# Patient Record
Sex: Female | Born: 1935 | Race: Black or African American | Hispanic: No | Smoking: Never smoker
Health system: Southern US, Community
[De-identification: ages and names within clinical notes are randomized; demographics above are authoritative.]

## PROBLEM LIST (undated history)

## (undated) DIAGNOSIS — E039 Hypothyroidism, unspecified: Secondary | ICD-10-CM

## (undated) DIAGNOSIS — M8580 Other specified disorders of bone density and structure, unspecified site: Secondary | ICD-10-CM

## (undated) DIAGNOSIS — C50919 Malignant neoplasm of unspecified site of unspecified female breast: Secondary | ICD-10-CM

## (undated) DIAGNOSIS — D0590 Unspecified type of carcinoma in situ of unspecified breast: Secondary | ICD-10-CM

## (undated) HISTORY — DX: Malignant neoplasm of unspecified site of unspecified female breast: C50.919

## (undated) HISTORY — DX: Other specified disorders of bone density and structure, unspecified site: M85.80

## (undated) HISTORY — PX: BREAST SURGERY: SHX581

## (undated) HISTORY — DX: Hypothyroidism, unspecified: E03.9

## (undated) HISTORY — DX: Unspecified type of carcinoma in situ of unspecified breast: D05.90

---

## 2001-06-27 HISTORY — PX: BREAST SURGERY: SHX581

## 2002-02-18 ENCOUNTER — Ambulatory Visit (HOSPITAL_COMMUNITY): Admission: RE | Admit: 2002-02-18 | Discharge: 2002-02-18 | Payer: Self-pay | Admitting: *Deleted

## 2002-02-18 ENCOUNTER — Encounter: Payer: Self-pay | Admitting: *Deleted

## 2002-05-15 ENCOUNTER — Encounter: Admission: RE | Admit: 2002-05-15 | Discharge: 2002-05-15 | Payer: Self-pay | Admitting: Family Medicine

## 2002-05-15 ENCOUNTER — Encounter: Payer: Self-pay | Admitting: Family Medicine

## 2002-06-04 ENCOUNTER — Encounter: Payer: Self-pay | Admitting: Family Medicine

## 2002-06-04 ENCOUNTER — Encounter (INDEPENDENT_AMBULATORY_CARE_PROVIDER_SITE_OTHER): Payer: Self-pay | Admitting: *Deleted

## 2002-06-04 ENCOUNTER — Other Ambulatory Visit: Admission: RE | Admit: 2002-06-04 | Discharge: 2002-06-04 | Payer: Self-pay | Admitting: Diagnostic Radiology

## 2002-06-04 ENCOUNTER — Encounter: Admission: RE | Admit: 2002-06-04 | Discharge: 2002-06-04 | Payer: Self-pay | Admitting: Family Medicine

## 2002-07-01 ENCOUNTER — Encounter: Payer: Self-pay | Admitting: Surgery

## 2002-07-01 ENCOUNTER — Encounter: Admission: RE | Admit: 2002-07-01 | Discharge: 2002-07-01 | Payer: Self-pay | Admitting: Surgery

## 2002-07-02 ENCOUNTER — Ambulatory Visit (HOSPITAL_BASED_OUTPATIENT_CLINIC_OR_DEPARTMENT_OTHER): Admission: RE | Admit: 2002-07-02 | Discharge: 2002-07-03 | Payer: Self-pay | Admitting: Surgery

## 2002-07-02 ENCOUNTER — Encounter (INDEPENDENT_AMBULATORY_CARE_PROVIDER_SITE_OTHER): Payer: Self-pay | Admitting: *Deleted

## 2002-07-02 ENCOUNTER — Ambulatory Visit: Admission: RE | Admit: 2002-07-02 | Discharge: 2002-08-13 | Payer: Self-pay | Admitting: *Deleted

## 2002-07-02 ENCOUNTER — Encounter: Payer: Self-pay | Admitting: Surgery

## 2002-07-25 ENCOUNTER — Ambulatory Visit (HOSPITAL_COMMUNITY): Admission: RE | Admit: 2002-07-25 | Discharge: 2002-07-25 | Payer: Self-pay | Admitting: Oncology

## 2002-07-30 ENCOUNTER — Ambulatory Visit (HOSPITAL_BASED_OUTPATIENT_CLINIC_OR_DEPARTMENT_OTHER): Admission: RE | Admit: 2002-07-30 | Discharge: 2002-07-30 | Payer: Self-pay | Admitting: Surgery

## 2002-07-30 ENCOUNTER — Encounter: Payer: Self-pay | Admitting: Surgery

## 2002-07-31 ENCOUNTER — Encounter: Payer: Self-pay | Admitting: Oncology

## 2002-07-31 ENCOUNTER — Ambulatory Visit (HOSPITAL_COMMUNITY): Admission: RE | Admit: 2002-07-31 | Discharge: 2002-07-31 | Payer: Self-pay | Admitting: Oncology

## 2002-08-02 ENCOUNTER — Encounter: Payer: Self-pay | Admitting: Oncology

## 2002-08-02 ENCOUNTER — Ambulatory Visit (HOSPITAL_COMMUNITY): Admission: RE | Admit: 2002-08-02 | Discharge: 2002-08-02 | Payer: Self-pay | Admitting: Oncology

## 2002-11-11 ENCOUNTER — Inpatient Hospital Stay (HOSPITAL_COMMUNITY): Admission: AD | Admit: 2002-11-11 | Discharge: 2002-11-15 | Payer: Self-pay | Admitting: Oncology

## 2002-11-13 ENCOUNTER — Encounter: Payer: Self-pay | Admitting: Oncology

## 2002-11-25 ENCOUNTER — Ambulatory Visit: Admission: RE | Admit: 2002-11-25 | Discharge: 2003-02-11 | Payer: Self-pay | Admitting: *Deleted

## 2003-01-10 ENCOUNTER — Ambulatory Visit (HOSPITAL_BASED_OUTPATIENT_CLINIC_OR_DEPARTMENT_OTHER): Admission: RE | Admit: 2003-01-10 | Discharge: 2003-01-10 | Payer: Self-pay | Admitting: Surgery

## 2003-03-19 ENCOUNTER — Encounter: Admission: RE | Admit: 2003-03-19 | Discharge: 2003-03-19 | Payer: Self-pay | Admitting: Oncology

## 2003-03-19 ENCOUNTER — Encounter: Payer: Self-pay | Admitting: Oncology

## 2003-08-24 ENCOUNTER — Emergency Department (HOSPITAL_COMMUNITY): Admission: EM | Admit: 2003-08-24 | Discharge: 2003-08-25 | Payer: Self-pay | Admitting: Emergency Medicine

## 2003-09-03 ENCOUNTER — Encounter: Admission: RE | Admit: 2003-09-03 | Discharge: 2003-09-03 | Payer: Self-pay | Admitting: Oncology

## 2003-10-15 ENCOUNTER — Ambulatory Visit (HOSPITAL_COMMUNITY): Admission: RE | Admit: 2003-10-15 | Discharge: 2003-10-15 | Payer: Self-pay | Admitting: Gastroenterology

## 2004-03-19 ENCOUNTER — Encounter: Admission: RE | Admit: 2004-03-19 | Discharge: 2004-03-19 | Payer: Self-pay | Admitting: Oncology

## 2004-06-22 ENCOUNTER — Ambulatory Visit: Payer: Self-pay | Admitting: Oncology

## 2004-07-07 ENCOUNTER — Ambulatory Visit (HOSPITAL_COMMUNITY): Admission: RE | Admit: 2004-07-07 | Discharge: 2004-07-07 | Payer: Self-pay | Admitting: Oncology

## 2004-12-23 ENCOUNTER — Ambulatory Visit: Payer: Self-pay | Admitting: Oncology

## 2005-03-21 ENCOUNTER — Encounter: Admission: RE | Admit: 2005-03-21 | Discharge: 2005-03-21 | Payer: Self-pay | Admitting: Oncology

## 2005-06-30 ENCOUNTER — Ambulatory Visit: Payer: Self-pay | Admitting: Oncology

## 2006-03-22 ENCOUNTER — Encounter: Admission: RE | Admit: 2006-03-22 | Discharge: 2006-03-22 | Payer: Self-pay | Admitting: Oncology

## 2006-06-24 ENCOUNTER — Ambulatory Visit: Payer: Self-pay | Admitting: Oncology

## 2006-06-29 LAB — CBC WITH DIFFERENTIAL/PLATELET
BASO%: 0.2 % (ref 0.0–2.0)
Basophils Absolute: 0 10*3/uL (ref 0.0–0.1)
EOS%: 2.4 % (ref 0.0–7.0)
HGB: 13.7 g/dL (ref 11.6–15.9)
MCH: 28.1 pg (ref 26.0–34.0)
MCHC: 32.7 g/dL (ref 32.0–36.0)
MCV: 86 fL (ref 81.0–101.0)
MONO%: 5.7 % (ref 0.0–13.0)
RBC: 4.85 10*6/uL (ref 3.70–5.32)
RDW: 14.1 % (ref 11.3–14.5)
lymph#: 2.1 10*3/uL (ref 0.9–3.3)

## 2006-06-29 LAB — COMPREHENSIVE METABOLIC PANEL
ALT: 18 U/L (ref 0–35)
AST: 23 U/L (ref 0–37)
Albumin: 4.7 g/dL (ref 3.5–5.2)
Alkaline Phosphatase: 114 U/L (ref 39–117)
BUN: 12 mg/dL (ref 6–23)
Chloride: 100 mEq/L (ref 96–112)
Potassium: 4.1 mEq/L (ref 3.5–5.3)
Sodium: 141 mEq/L (ref 135–145)

## 2006-06-29 LAB — LACTATE DEHYDROGENASE: LDH: 208 U/L (ref 94–250)

## 2006-07-18 ENCOUNTER — Encounter: Admission: RE | Admit: 2006-07-18 | Discharge: 2006-07-18 | Payer: Self-pay | Admitting: Oncology

## 2006-12-26 ENCOUNTER — Ambulatory Visit: Payer: Self-pay | Admitting: Oncology

## 2007-01-02 LAB — LACTATE DEHYDROGENASE: LDH: 210 U/L (ref 94–250)

## 2007-01-02 LAB — CBC WITH DIFFERENTIAL/PLATELET
BASO%: 0.5 % (ref 0.0–2.0)
HCT: 39 % (ref 34.8–46.6)
HGB: 13.1 g/dL (ref 11.6–15.9)
MCHC: 33.6 g/dL (ref 32.0–36.0)
MONO#: 0.3 10*3/uL (ref 0.1–0.9)
NEUT%: 59 % (ref 39.6–76.8)
WBC: 5.5 10*3/uL (ref 3.9–10.0)
lymph#: 1.8 10*3/uL (ref 0.9–3.3)

## 2007-01-02 LAB — COMPREHENSIVE METABOLIC PANEL
CO2: 29 mEq/L (ref 19–32)
Creatinine, Ser: 0.9 mg/dL (ref 0.40–1.20)
Glucose, Bld: 75 mg/dL (ref 70–99)
Total Bilirubin: 0.8 mg/dL (ref 0.3–1.2)

## 2007-03-28 ENCOUNTER — Encounter: Admission: RE | Admit: 2007-03-28 | Discharge: 2007-03-28 | Payer: Self-pay | Admitting: Oncology

## 2007-07-20 ENCOUNTER — Ambulatory Visit: Payer: Self-pay | Admitting: Oncology

## 2008-03-28 ENCOUNTER — Encounter: Admission: RE | Admit: 2008-03-28 | Discharge: 2008-03-28 | Payer: Self-pay | Admitting: Family Medicine

## 2008-04-16 ENCOUNTER — Ambulatory Visit: Payer: Self-pay | Admitting: Oncology

## 2008-07-10 ENCOUNTER — Ambulatory Visit: Payer: Self-pay | Admitting: Oncology

## 2009-03-07 IMAGING — CT CT ABD-PELV W/O CM
3 of 8 series · 11 of 42 positions shown, 17 images · IV contrast (CONTRAST)
Comparison: NONE

CLINICAL DATA: Tani Tila:Bela Proano, FNP Right lower quadrant 
pain,  bloating.  Hx. of breast cancer. 

CT ABDOMEN AND PELVIS WITHOUT AND WITH INTRAVENOUS AND FOLLOWING 
ORAL  CONTRAST
TECHNIQUE: Multiple axial 5-millimeter thick slices at 
5-millimeter intervals were obtained from the lung base through 
the pelvis following the intravenous administration of 100 cc of 
Optiray 350 at a rate of 3 cc per second.  Oral contrast was 
administered as well.  Arterial and venous phase imaging was 
obtained in the upper abdomen with delayed images obtained through 
the pelvis.

[Series 4: venous · axial · portal-venous · 0.68mm/px · z∈[+672,+952]mm · 5 of 84 slices shown, 10 images]
[im 14/84  soft-tissue]
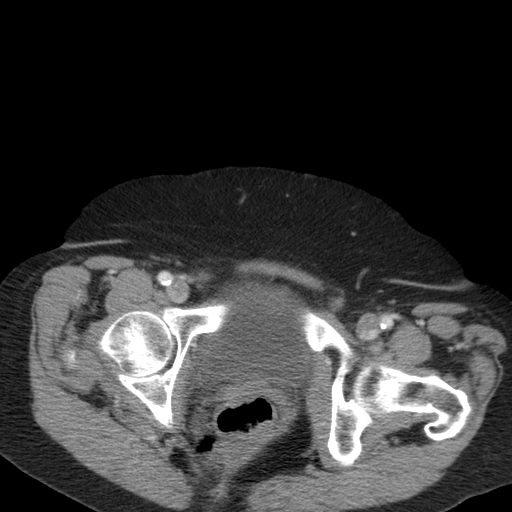
[im 14/84  bone]
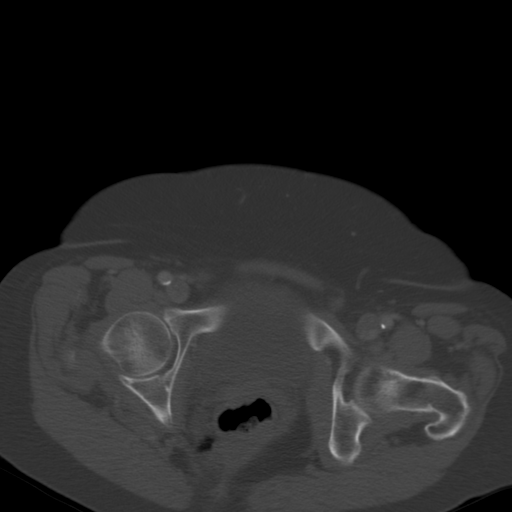
[im 28/84  soft-tissue]
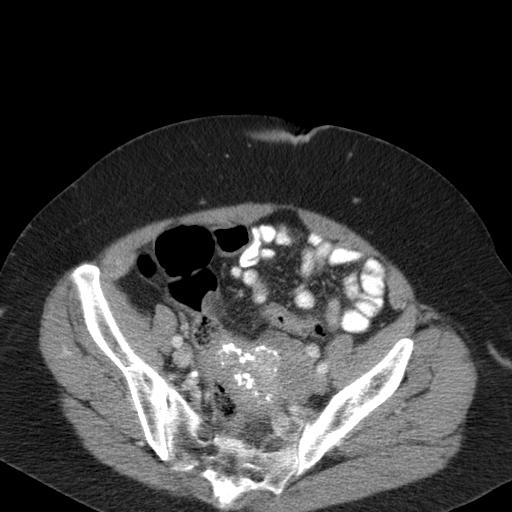
[im 28/84  lung]
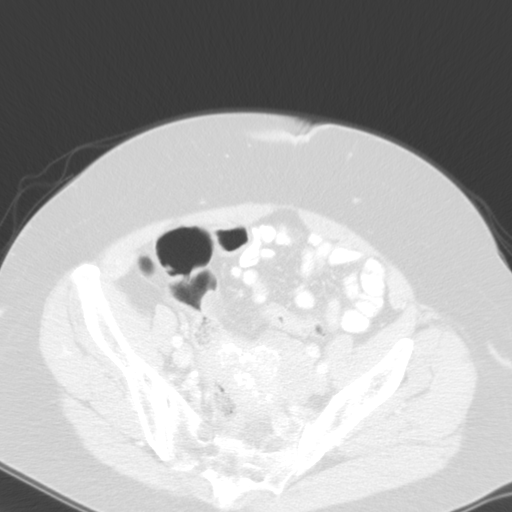
[im 42/84  soft-tissue]
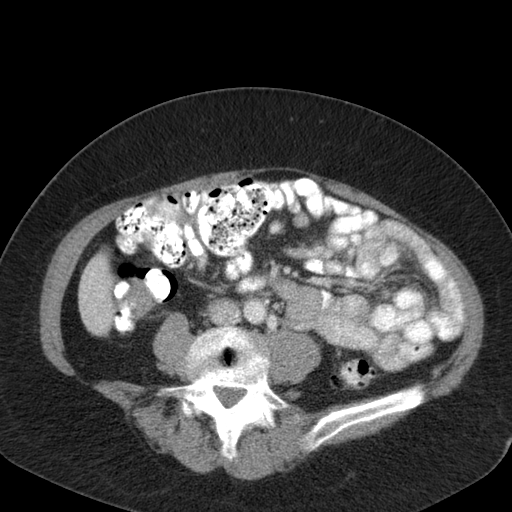
[im 42/84  lung]
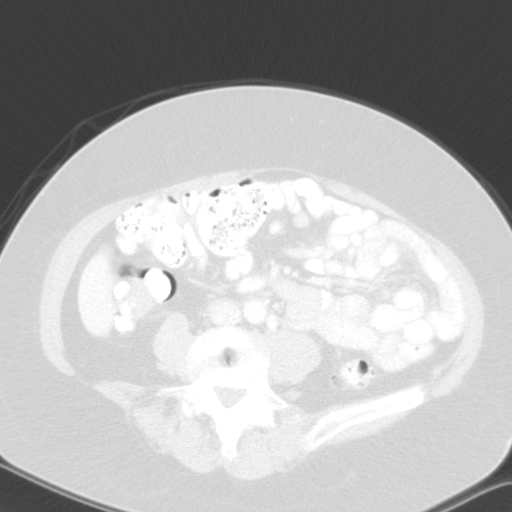
[im 56/84  soft-tissue]
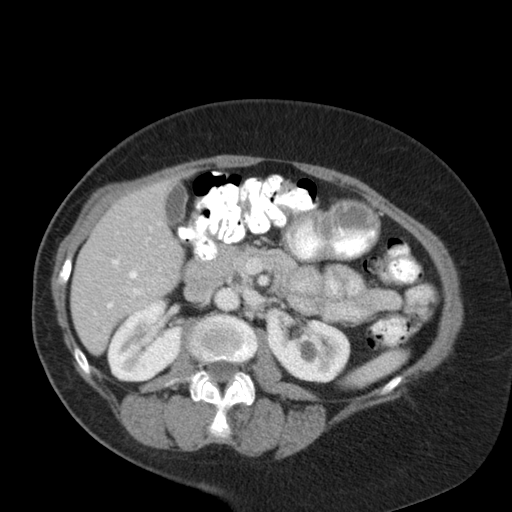
[im 56/84  lung]
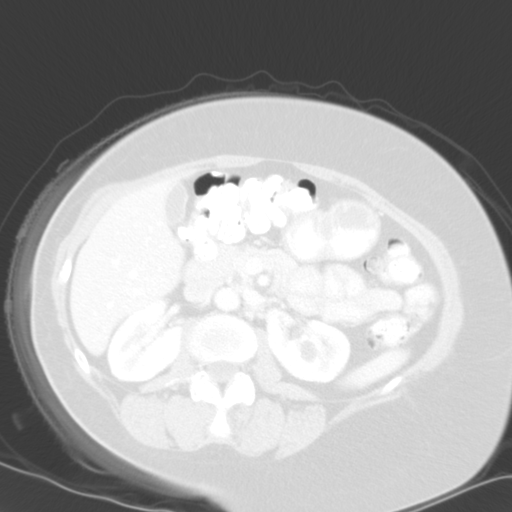
[im 70/84  soft-tissue]
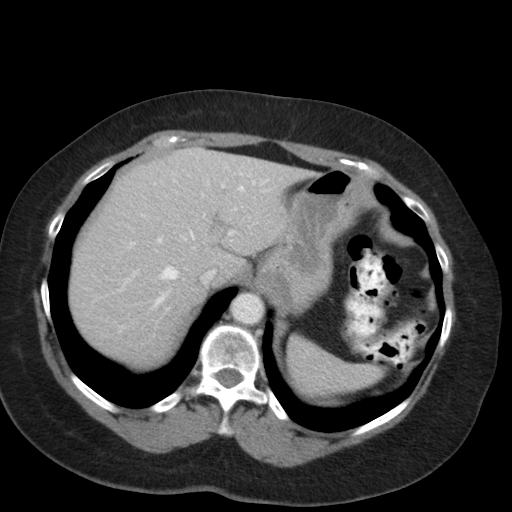
[im 70/84  lung]
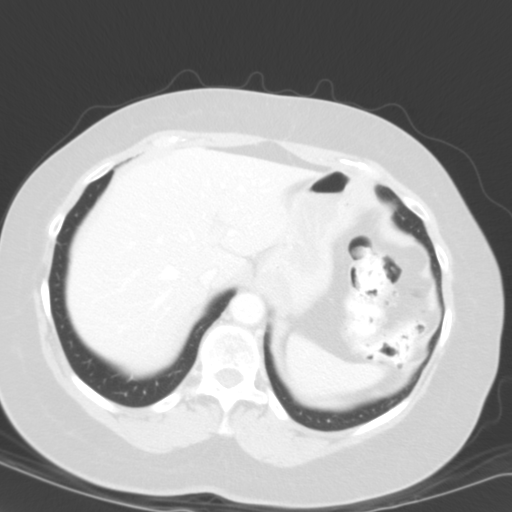

[Series 9: delays · axial · 0.68mm/px · z∈[+725,+890]mm · 3 of 67 slices shown]
[im 17/67  soft-tissue]
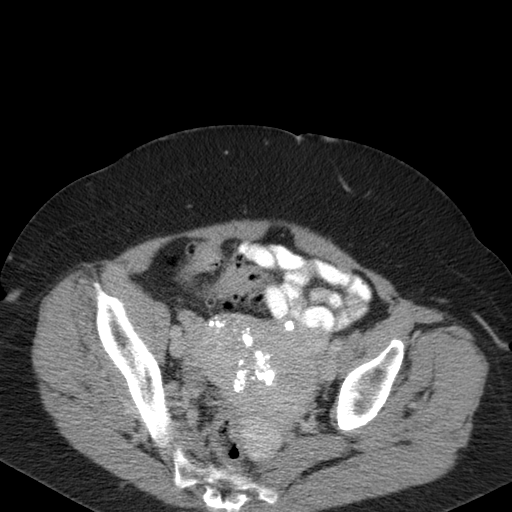
[im 34/67  soft-tissue]
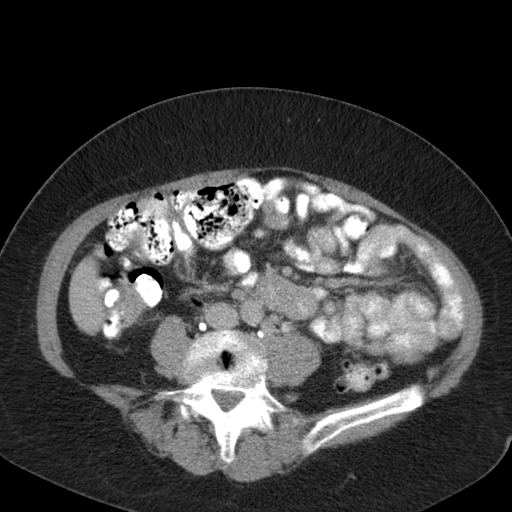
[im 50/67  soft-tissue]
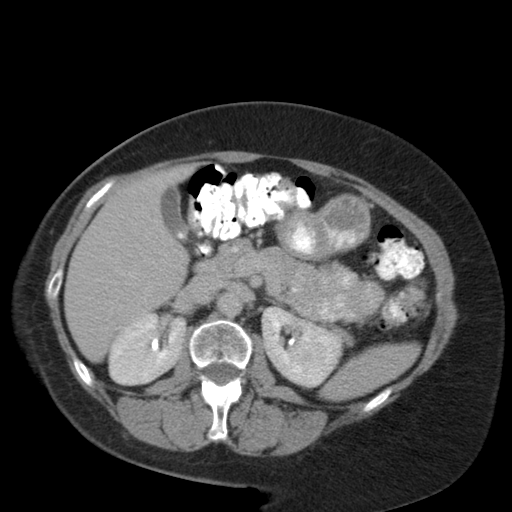

[coronals · coronal · 0.81mm/px · 3 of 72 slices shown, 4 images]
[im 24/72  soft-tissue]
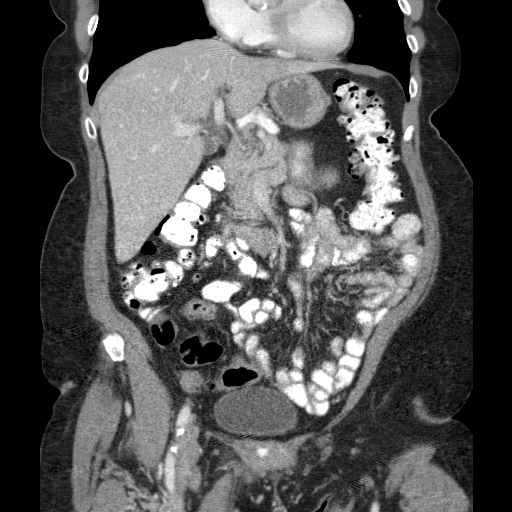
[im 32/72  soft-tissue]
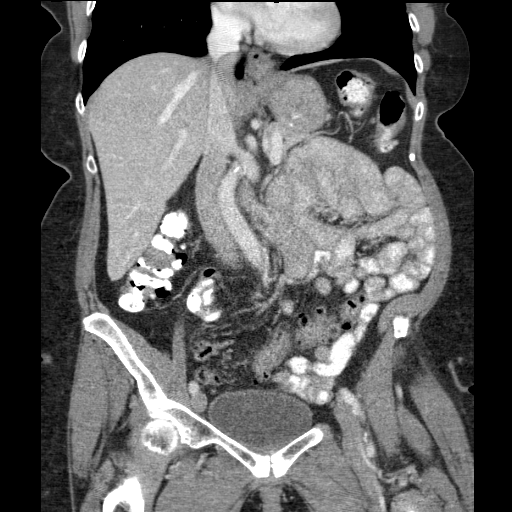
[im 32/72  bone]
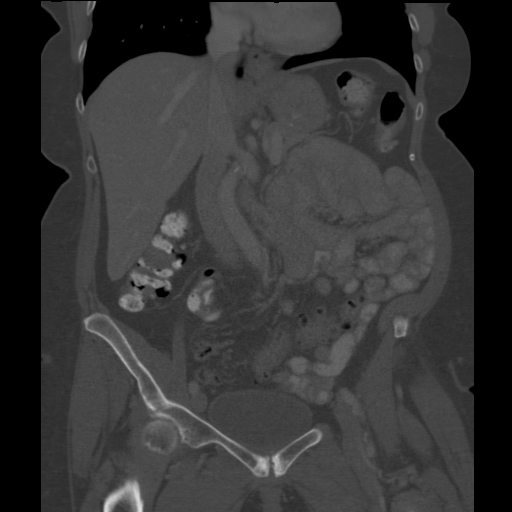
[im 40/72  soft-tissue]
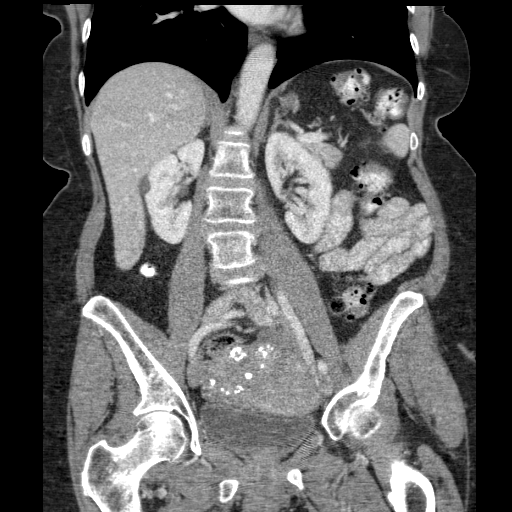

[11 of 42 positions shown; findings below may reference images not displayed]

FINDINGS: The heart size is within normal limits.  Visualized 
portions of the lung bases are within normal limits.  Liver, 
spleen, adrenal glands, and gallbladder are within normal limits.  
Patient appears to have a mobile cecum.  The appendix is 
visualized and demonstrates normal caliber and wall thickness.  
There is evidence of diverticula in the sigmoid colon, as well as 
the descending colon.  The uterus appears to be enlarged.  The 
endometrial stripe is not well seen.  There are multiple punctate 
calcifications noted, which may be associated with leiomyomata.  
The right ovary is grossly within normal limits.  The left ovary 
is not discretely seen.  No gross intraluminal mass is noted in 
the bladder.  There is suggestion of a polyp in the region of the 
rectum. No inflammatory changes are seen in the abdomen or pelvis. 
 There is mild dextroscoliosis.  Multiple hypodense cortical 
lesions are seen in each kidney.  The largest in the right kidney 
measures approximately 12 mm in diameter and the largest on the 
left is seen in the midpole and measures approximately 11 mm in 
diameter.  These are too small to obtain an accurate density 
measurement.  These likely represent renal cysts.  The pancreas 
demonstrates a dorsal and ventral duct consistent with pancreas 
divisum.  No pancreatic masses are evident. No free fluid or 
lymphadenopathy.
IMPRESSION: Diverticulosis, but no evidence of diverticulitis. 
Mobile cecum. No evidence of appendicitis or aortic aneurysm. 
Bilateral hypodense lesions of the kidneys, which likely represent 
renal cysts. Findings consistent with pancreas divisum. The 
initial suggestion of a polyp in the rectum on the venous phase 
images do not persist on delayed imaging and likely represents 
fecal material. Enlarged uterus with multiple calcifications 
suggesting calcified leiomyomata. Mihir Rodrigue, M.D. 
Date: 01/09/2007 JH  JLM

## 2009-03-30 ENCOUNTER — Encounter: Admission: RE | Admit: 2009-03-30 | Discharge: 2009-03-30 | Payer: Self-pay | Admitting: Oncology

## 2009-07-02 ENCOUNTER — Ambulatory Visit: Payer: Self-pay | Admitting: Oncology

## 2009-07-09 LAB — COMPREHENSIVE METABOLIC PANEL
ALT: 15 U/L (ref 0–35)
BUN: 14 mg/dL (ref 6–23)
CO2: 26 mEq/L (ref 19–32)
Calcium: 9.1 mg/dL (ref 8.4–10.5)
Chloride: 101 mEq/L (ref 96–112)
Creatinine, Ser: 0.83 mg/dL (ref 0.40–1.20)
Potassium: 3.8 mEq/L (ref 3.5–5.3)
Total Protein: 6.5 g/dL (ref 6.0–8.3)

## 2009-07-09 LAB — CBC WITH DIFFERENTIAL/PLATELET
BASO%: 0.2 % (ref 0.0–2.0)
EOS%: 1.7 % (ref 0.0–7.0)
HCT: 40 % (ref 34.8–46.6)
HGB: 13.1 g/dL (ref 11.6–15.9)
MONO#: 0.4 10*3/uL (ref 0.1–0.9)
MONO%: 5.9 % (ref 0.0–14.0)
RDW: 14.4 % (ref 11.2–14.5)
WBC: 6.1 10*3/uL (ref 3.9–10.3)

## 2010-03-11 ENCOUNTER — Encounter: Admission: RE | Admit: 2010-03-11 | Discharge: 2010-03-11 | Payer: Self-pay | Admitting: Oncology

## 2010-03-15 ENCOUNTER — Encounter: Admission: RE | Admit: 2010-03-15 | Discharge: 2010-03-15 | Payer: Self-pay | Admitting: Oncology

## 2010-03-22 ENCOUNTER — Encounter: Admission: RE | Admit: 2010-03-22 | Discharge: 2010-03-22 | Payer: Self-pay | Admitting: Family Medicine

## 2010-04-07 ENCOUNTER — Ambulatory Visit: Payer: Self-pay | Admitting: Oncology

## 2010-04-08 ENCOUNTER — Ambulatory Visit (HOSPITAL_COMMUNITY): Admission: RE | Admit: 2010-04-08 | Discharge: 2010-04-09 | Payer: Self-pay | Admitting: Surgery

## 2010-04-08 ENCOUNTER — Encounter (INDEPENDENT_AMBULATORY_CARE_PROVIDER_SITE_OTHER): Payer: Self-pay | Admitting: Surgery

## 2010-05-07 ENCOUNTER — Ambulatory Visit: Payer: Self-pay | Admitting: Oncology

## 2010-07-17 ENCOUNTER — Encounter: Payer: Self-pay | Admitting: Oncology

## 2010-07-18 ENCOUNTER — Encounter: Payer: Self-pay | Admitting: Oncology

## 2010-09-08 LAB — CBC
HCT: 38.5 % (ref 36.0–46.0)
Hemoglobin: 12.8 g/dL (ref 12.0–15.0)
MCHC: 33.2 g/dL (ref 30.0–36.0)
RBC: 4.48 MIL/uL (ref 3.87–5.11)
WBC: 4.5 10*3/uL (ref 4.0–10.5)

## 2010-09-08 LAB — SURGICAL PCR SCREEN
MRSA, PCR: NEGATIVE
Staphylococcus aureus: NEGATIVE

## 2010-09-08 LAB — COMPREHENSIVE METABOLIC PANEL
ALT: 20 U/L (ref 0–35)
AST: 21 U/L (ref 0–37)
Alkaline Phosphatase: 71 U/L (ref 39–117)
CO2: 28 mEq/L (ref 19–32)
Calcium: 9.6 mg/dL (ref 8.4–10.5)
Chloride: 107 mEq/L (ref 96–112)
Creatinine, Ser: 0.8 mg/dL (ref 0.4–1.2)
GFR calc non Af Amer: >60 mL/min (ref >60)
Total Bilirubin: 1.2 mg/dL (ref 0.3–1.2)
Total Protein: 6.5 g/dL (ref 6.0–8.3)

## 2010-09-08 LAB — DIFFERENTIAL
Basophils Absolute: 0 10*3/uL (ref 0.0–0.1)
Lymphs Abs: 1.5 10*3/uL (ref 0.7–4.0)
Monocytes Absolute: 0.3 10*3/uL (ref 0.1–1.0)
Neutrophils Relative %: 58 % (ref 43–77)

## 2010-11-12 NOTE — Op Note (Signed)
   NAME:  Rebecca Carrillo, Rebecca Carrillo                        ACCOUNT NO.:  [id number]   MEDICAL RECORD NO.:  [id number]                   PATIENT TYPE:  AMB   LOCATION:  DSC                                  FACILITY:  [facility]   PHYSICIAN:  Sandria Bales. Ezzard Standing, M.D.               DATE OF BIRTH:  12-20-1934   DATE OF PROCEDURE:  01/10/2003  DATE OF DISCHARGE:                                 OPERATIVE REPORT   PREOPERATIVE DIAGNOSIS:  Carcinoma of the right breast, completion of  chemotherapy.   POSTOPERATIVE DIAGNOSIS:  Carcinoma of the right breast, completion of  chemotherapy.   PROCEDURE:  Removal of left subclavian porta-cath.   SURGEON:  Sandria Bales. Ezzard Standing, M.D.   ANESTHESIA:  15 mL of 1% Xylocaine with epinephrine.   INDICATIONS FOR PROCEDURE:  Ms. Tiedt is a pleasant 76 year old black  female who has a T1, N1 carcinoma of the right breast. She has completed  chemotherapy with Dr. Cephas Darby. She is now about midway through her  radiation therapy with Dr. Jackelyn Knife and comes for removal  of the porta-  cath that was placed for chemotherapy.   DESCRIPTION OF PROCEDURE:  The patient was placed in the supine position.  Her left upper chest was prepped with Betadine solution and sterilely  draped. I infiltrated the skin with about 15 mL of saline.   I then cut down to the porta-cath which I removed in 1 piece. The patient  did not want to keep her porta-cath. I then closed it with 5-0 Vicryl. I  painted it with tincture of Benzoin and Steri-Strips. The patient tolerated  the procedure well.   She will see me back in a routine visit in about  6 to 8 weeks. She will  call earlier if there is any question with the incision.                                               Sandria Bales. Ezzard Standing, M.D.    DHN/MEDQ  D:  01/10/2003  T:  01/11/2003  Job:  [id number]   cc:   Genene Churn. Cyndie Chime, M.D.  [address] [facility]  [place]  [place] [address]  Fax: [phone]   Elizabeth Palau, M.D.   Elmer Sow.  Dorna Bloom, M.D.  [address] [facility]  [place]  [place]  [address]  Fax: [phone]

## 2010-11-12 NOTE — Discharge Summary (Signed)
NAME:  Rebecca Carrillo, Rebecca Carrillo                        ACCOUNT NO.:  [id number]   MEDICAL RECORD NO.:  [id number]                   PATIENT TYPE:  INP   LOCATION:  0275                                 FACILITY:  [facility]   PHYSICIAN:  Leighton Roach. Truett Perna, M.D.              DATE OF BIRTH:  1935-03-26   DATE OF ADMISSION:  11/11/2002  DATE OF DISCHARGE:  11/15/2002                                 DISCHARGE SUMMARY   DISCHARGE DIAGNOSES:  1. Severe oral and esophageal candidiasis, improved with Diflucan.  2. Dehydration secondary to #1.  3. Pain secondary to #1, improved.  4. Fever possibly secondary to #1. All cultures negative.  5. Pancytopenia secondary to chemotherapy with stable counts at time of     discharge. Patient to receive Aranesp 100 mcg subcu prior to discharge.  6. Stage II breast cancer status post cycle #5 Epirubicin/Cytoxan/5-FU day     #11.  7. Hypertension, stable.  8. Hypothyroidism, on Synthroid.   CONSULTATIONS:  None.   PROCEDURE:  None.   HISTORY OF PRESENT ILLNESS:  [name] is a 76 year old woman who was  diagnosed with stage II breast cancer in November of 2003. She underwent a  right breast partial mastectomy/right axillary node dissection in January of  2004 with pathology showing a 1.5 cm tumor; 1 sentinel lymph node positive,  20 lymph nodes negative; no vascular or lymphatic invasion; ER/PR negative;  HER-2/neu not over-expressed. The patient was started on treatment with  Epirubicin, Cytoxan, and 5-FU chemotherapy. Prior to this admission, she had  completed five of a total planned six cycles. She received her most recent  treatment on Nov 04, 2002, followed by a Neulasta injection on Nov 05, 2002.  She had tolerated all previous cycles well.   On Nov 11, 2002, the patient presented to the office with complaints of a 3-  4 day history of worsening mouth and throat pain to the point that she was  unable to maintain adequate oral intake. She reported thick  and clear oral  secretions. She used Viscous lidocaine and Motrin the weekend prior to  admission with only minimal improvement. She denied any fever, shortness of  breath, cough or diarrhea. She was found to have extensive oral candidiasis  and was subsequently admitted for Diflucan intravenously and hydration.  Vital signs in the office showed a temperature of 100.5, heart rate 111,  respirations 16, blood pressure 110/76.   PHYSICAL EXAMINATION:  Admission physical examination was unremarkable  except for extensive oral candidiasis.   HOSPITAL COURSE:  The patient was admitted to [facility]  on Nov 11, 2002. Lab work showed hemoglobin of 10.3, white count 4.22, ANC  3.73, platelets 106,000. The patient was started on Diflucan 200 mg IV  daily. She received IV Morphine for pain control. By day three of her  admission, the candidiasis was significantly improved and patient was  tolerating a  soft diet. By day four of her admission, there was resolution  of the thrush and Diflucan was changed to oral route. Her pain had also  resolved. On Nov 12, 2002, the patient developed a fever up to 100.9  degrees. Blood culture from Port-A-Cath was obtained and remained negative.  A urine culture was also checked and was negative. The patient was placed on  cefepime 2 g  IV every eight hours. She continued to have intermittent  temperature elevations over the next 48 hours. Chest x-ray obtained on October 14, 2002 was negative.  Her Port-A-Cath site remained unremarkable. The  cefepime was continued for three days. The patient remained afebrile in the  24 hours prior to her discharge. The fevers were felt to possibly be  secondary to the extensive oral/esophageal candidiasis. The patient has been  instructed to call the office should she develop recurrent fever following  her discharge home.   The patient's blood counts were followed closely secondary to recent  chemotherapy. Her  platelet count declined to a low of 85,000. She had no  bleeding. On the day prior to discharge, her platelet count was 99,000. Her  white count declined to a low of 2.0 on Nov 13, 2002 and was 3.0 on Nov 14, 2002. She had received Neulasta in the office the day following her last  cycle of chemotherapy. Her hemoglobin declined to 7.4 on Nov 13, 2002 and  was 7.9 on Nov 14, 2002. She was asymptomatic. The patient received a dose  of Aranesp 100 mcg subq prior to her discharge on Nov 15, 2002. She will  also start ferrous sulfate 325 mg twice daily as an outpatient. We will  check labs in the office on Nov 18, 2002.   The patient was noted to be hypokalemic during her hospitalization.  Potassium was added to her IV fluids with normalization of her potassium  level. Prior to admission, the patient was on HCTZ for hypertension. She was  somewhat hypotensive during her hospitalization, with systolic blood  pressure in the 100 range. We held the HCTZ throughout her hospitalization.  On the day of discharge, her blood pressure was 129/71. She will resume the  HCTZ as an outpatient.   On Nov 15, 2002, the patient was felt to be stable for discharge home. She  is scheduled to see Dr. Cyndie Chime in the office on Nov 18, 2002 at which  time she will have her blood counts checked. The patient will call in the  interim if there are any problems.   LABORATORY DATA:  On Nov 14, 2002, sodium 143, potassium 3.5, BUN 4,  creatinine 0.6, glucose 95, calcium 8.4, hemoglobin 7.9, white count 3.0,  ANC 2.0, platelet count 99,000. Urine culture and blood culture from Port-A-  Cath negative.   RADIOLOGY:  Chest x-ray on Nov 13, 2002:  No evidence of acute disease.   DISCHARGE INSTRUCTIONS:  1. Condition:  Improved at discharge.  2. Activity:  No restrictions.  3. Diet:  No restrictions.  4. Wound care:  Routine care of Port-A-Cath. 5. Special instructions:  Call for fever greater than 101 degrees,  worsening     mouth or throat pain, shortness of breath, or any other problems.  6. Followup:  Keep scheduled appointment with Dr. Cyndie Chime on May 24.     2004.   DISCHARGE MEDICATIONS:  1. Diflucan 100 mg daily for four days.  2. Synthroid 75 mcg daily.  3. Hydrochlorothiazide 25 mg daily.  4. Vicodin 1 tablet every 6 hours as needed for pain.  5.     Ativan 0.5 mg every 6 hours as needed.  6. Compazine 10 mg every 6 hours as needed for nausea.  7. Ferrous sulfate 325 mg twice daily.     Lonna Cobb, N.P.                         Leighton Roach. Truett Perna, M.D.    LT/MEDQ  D:  11/15/2002  T:  11/15/2002  Job:  [id number]   cc:   Genene Churn. Cyndie Chime, M.D.  [address] [facility]  [place]  [place] [address]  Fax: [phone]

## 2010-11-12 NOTE — Op Note (Signed)
NAME:  Rebecca Carrillo, Rebecca Carrillo                        ACCOUNT NO.:  [id number]   MEDICAL RECORD NO.:  [id number]                   PATIENT TYPE:  AMB   LOCATION:  ENDO                                 FACILITY:  [facility]   PHYSICIAN:  Anselmo Rod, M.D.               DATE OF BIRTH:  11/24/1934   DATE OF PROCEDURE:  10/15/2003  DATE OF DISCHARGE:                                 OPERATIVE REPORT   PROCEDURE:  Screening colonoscopy.   ENDOSCOPIST:  Charna Elizabeth, M.D.   INSTRUMENT USED:  Olympus video colonoscope.   INDICATIONS FOR PROCEDURE:  This is a 76 year old African American female  with a history of breast cancer and rectal bleeding undergoing screening  colonoscopy.  Rule out colonic polyps, masses, etc.   PROCEDURE PERFORMED:  Informed consent was procured from the patient.  The  patient fasted for eight hours prior to the procedure and prepped with a  bottle of magnesium citrate and a gallon of GOLYTELY the night prior to the  procedure.   PREPROCEDURE PHYSICAL EXAMINATION:  VITAL SIGNS:  The patient had stable  vital signs.  NECK:  Supple.  CHEST:  Clear to auscultation.  HEART:  S1 and S2 regular.  ABDOMEN:  Soft with normal bowel sounds.   DESCRIPTION OF PROCEDURE:  The patient was placed in the left lateral  decubitus position, sedated with 60 mg of Demerol and 8 mg of Versed  intravenously.  Once the patient was adequately sedated and maintained on  low flow oxygen, continuous cardiac monitoring, the Olympus video  colonoscope was advanced from the rectum to the cecum.  Scattered  diverticula were seen throughout the colon.  Small internal hemorrhoids were  appreciated on retroflexion in the rectum.  No masses or polyps were  identified.  There was a large amount of residual stool in the colon.  Small  lesions could have been missed.  The patient's position was changed from the  left lateral to the supine and the lateral position with gentle application  of abdominal  pressure to reach the cecum.  In spite of multiple washings,  visualization was inadequate as the patient had solid stool in the colon.   IMPRESSION:  1. Small internal hemorrhoids.  2. Scattered diverticulosis.  3. No masses or polyps seen.  4. Large amount of residual stool in the colon.  Small lesions could have     been missed.   RECOMMENDATIONS:  1. Repeat CRC screening in the next five years unless the patient develops     any abnormal symptoms in the interim.  2. Continue a high fiber diet with liberal fluid intake.  Brochures on     diverticulosis have been given to the patient for education.  3. Outpatient followup in the next 2 weeks for further recommendation.  and     terminal ileum without difficulty.  The entire colonic mucosa was     encountered and  appeared normal with no evidence of erosions,     ulcerations, masses or polyps.  Small nonbleeding internal hemorrhoids     were seen on retroflexion in the rectum.  The patient tolerated the     procedure well without complications.   IMPRESSION:  Normal colonoscopy to the terminal ileum except for small  nonbleeding internal hemorrhoids.   RECOMMENDATIONS:  1. Levbid 0.375 mg b.i.d. was prescribed for the patient to help with her     symptoms.  2. High fiber diet is re-emphasized.  3. Outpatient followup within the next two weeks' time for further     recommendation.                                               Anselmo Rod, M.D.    JNM/MEDQ  D:  10/15/2003  T:  10/16/2003  Job:  [id number]   cc:   Teena Irani. Arlyce Dice, M.D.  [address]  [place]  [place] [address]  Fax: [phone]   Genene Churn. Cyndie Chime, M.D.  [address] [facility]  [place]  [place] [address]  Fax: [phone]   Sandria Bales. Ezzard Standing, M.D.  [address]  [place]  [place] [address]  Fax: [phone]

## 2010-11-12 NOTE — H&P (Signed)
NAME:  Rebecca Carrillo, Rebecca Carrillo                        ACCOUNT NO.:  [id number]   MEDICAL RECORD NO.:  [id number]                   PATIENT TYPE:  INP   LOCATION:  0275                                 [facility]ACILITY:  [facility]   PHYSICIAN:  Leighton Roach. Truett Perna, M.D.              DATE OF BIRTH:  Feb 02, 1935   DATE OF ADMISSION:  11/11/2002  DATE OF DISCHARGE:                                HISTORY & PHYSICAL   CHIEF COMPLAINT:  A four-day history of worsening mouth and throat pain.   HISTORY OF PRESENT ILLNESS:  The patient is a 76 year old woman who was  diagnosed with stage 2 breast cancer in November 2003 following an abnormal  mammogram.  She underwent a right breast partial mastectomy/right axillary  node dissection in January 2004 by Dr. Ezzard Standing with pathology showing a 1.5  cm tumor; one sentinel lymph node positive, 20 lymph nodes negative; no  vascular or lymphatic invasion; ER/PR negative; HER2/neu not over-expressed.  The patient was placed on treatment with epirubicin, Cytoxan, and 5FU.  She  has completed five of six planned cycles, with her most recent treatment  given on Nov 04, 2002.  She received Neulasta on Nov 05, 2002.  Thus far,  she has tolerated the chemotherapy well.   The patient presents to the office today with a three- to four-day history  of worsening mouth and throat pain.  She used viscous lidocaine and Motrin  over the weekend with only minimal improvement.  Oral intake is poor  secondary to the pain.  She reports thick, clear oral secretions.  She  denies any fever, shortness of breath, cough, or diarrhea.   PAST MEDICAL HISTORY:  1. Stage 2 breast cancer as above.  2. Hypertension.  3. Hypothyroidism.  4. Status post Port-A-Cath placement on July 30, 2002.   HOME MEDICATIONS:  1. Viscous lidocaine p.r.n.  2. Coumadin 1 mg daily.  3. Synthroid 0.75 mg daily.  4. Hydrochlorothiazide 25 mg daily.  5. Hydrocodone p.r.n.  6. Ativan p.r.n.  7. Compazine p.r.n.   ALLERGIES:  No known drug allergies.   FAMILY HISTORY:  Mother deceased age 8 with an aneurysm.  Father deceased  age 23 with lung cancer.  Brother with history of prostate cancer.   SOCIAL HISTORY:  The patient lives in [place].  She is married.  She has  six children.  She has no history of tobacco use.   REVIEW OF SYSTEMS:  The patient denies any fever.  Her oral intake has been  markedly diminished over the past three to four days secondary to mouth and  throat pain.  She reports increased oral secretions.  She denies any  shortness of breath or cough.  She denies any chest pain.  She has had no  peripheral edema.  She denies any diarrhea.   PHYSICAL EXAMINATION:  VITAL SIGNS:  Temperature 100.5, heart rate 111,  respirations 16, blood pressure 110/76;  blood pressure and pulse sitting  102/64 and 112; standing blood pressure 100/70 and heart rate 120.  GENERAL:  Tearful African-American female in no acute distress.  HEENT:  Normocephalic, atraumatic.  Pupils equal, round, and reactive to  light; extraocular movements are intact.  Sclerae anicteric.  Oropharynx is  remarkable for extensive thrush.  The posterior pharynx is erythematous.  No  ulcers noted.  CHEST:  Lungs are clear bilaterally.  Port-A-Cath site nontender and without  erythema.  Lumpectomy site is unremarkable.  CARDIOVASCULAR:  Regular, tachycardic.  ABDOMEN:  Soft and nontender.  EXTREMITIES:  No edema.  NEUROLOGIC:  Alert and oriented.  Moves all extremities.   LABORATORY DATA:  Hemoglobin 10.3; white count 4.22; ANC 3.73; platelets  106,000.   IMPRESSION AND PLAN:  The patient is a 76 year old woman with stage 2 breast  cancer status post cycle #5 epirubicin/Cytoxan/5FU day #7 who presents today  with worsening mouth and throat pain.   1. Oral candidiasis.  We will begin Diflucan 200 mg IV daily.  2. Mouth and throat pain secondary to #1.  Intravenous morphine as needed.  3. Dehydration.  Check lab  work and begin IV fluids.  4. Stage 2 breast cancer status post cycle #5 epirubicin/Cytoxan/5FU - day     #7.  We will follow counts during her hospitalization.  5. Hypertension.  Hold hydrochlorothiazide.  6. Hypothyroid.  Continue Synthroid.   The patient seen and examined by Dr. Truett Perna.     Lonna Cobb, N.P.                         Leighton Roach. Truett Perna, M.D.    LT/MEDQ  D:  11/12/2002  T:  11/12/2002  Job:  [id number]   cc:   Genene Churn. Cyndie Chime, M.D.  [address] [facility]  [place]  [place] [address]  Fax: [phone]

## 2010-11-12 NOTE — Op Note (Signed)
NAME:  Rebecca Carrillo, Rebecca Carrillo                        ACCOUNT NO.:  [id number]   MEDICAL RECORD NO.:  [id number]                   PATIENT TYPE:  SPE   LOCATION:  DFTL                                 FACILITY:  [facility]   PHYSICIAN:  Sandria Bales. Ezzard Standing, M.D.               DATE OF BIRTH:  April 25, 1935   DATE OF PROCEDURE:  07/02/2002  DATE OF DISCHARGE:  06/04/2002                                 OPERATIVE REPORT   PREOPERATIVE DIAGNOSIS:  Carcinoma right breast at the 1 o'clock position  approximately 7 cm off the edge of the areola.   POSTOPERATIVE DIAGNOSIS:  Carcinoma right breast at the 1 o'clock position  approximately 7 cm off the edge of the areola with a positive sentinel lymph  node.   OPERATION PERFORMED:  Right breast partial mastectomy, injection of  isosulfan blue dye, right sentinel lymph node (counts of 1600 with  background of 30) and right axillary node dissection.   SURGEON:  Sandria Bales. Ezzard Standing, M.D.   ASSISTANT:  Scarlette Shorts PAS Bowman-Gray   ANESTHESIA:  LMA.   DRAINS:  Right axillary 10 mm Jackson-Pratt drain.   INDICATIONS FOR PROCEDURE:  The patient is a pleasant 76 year old black  female who has had a recent mammogram which showed a mass in the upper inner  aspect of her right breast.  She underwent a core biopsy which showed  invasive ductal carcinoma.  Discussion has been carried out with the patient  regarding further treatment.  She has elected to undergo a lumpectomy and  sentinel lymph node biopsy.  She knows this will be in conjunction with  radiation therapy.  The indications and potential complications including  but not limited to bleeding, infection, axillary dissection with lymphedema  of the right arm were discussed with the patient.   DESCRIPTION OF PROCEDURE:  The patient was placed in a supine position,  given a general LMA anesthesia.  Her right chest and axilla were prepped  with Betadine solution and sterilely draped.  Prior to draping of the  breast, I first identified the tumor with an ultrasound which was about the  1 o'clock position in the right breast, 7 cm off the edge of the areola and  I marked this.  Secondly I infiltrated the subareolar space with isosulfan  blue.  She had already been injected with sulfur colloid in nuclear medicine  and a marker for the sentinel lymph node using the Neoprobe identified an  area where I thought the sentinel lymph node lay.   After prepping her breast with Betadine and sterilely draping her, I first  went after the axillary lymph node.  The area that was hot was lateral to  the pectoralis major in the lower one third of the axilla.  Sharp dissection  was carried down directly over what proved to be a hot blue lymph node.  The  counts of the sentinel lymph node were approximately  1600 with a background  of 30 and the node was blue.  This was sent off and Kieth Brightly called back  and said the lymph node was positive.  While Dr. Almyra Free was evaluating the  lymph node for the Touch Preps I was doing the primary partial mastectomy of  the right breast.  I tried to excise a block of breast tissue at least 1 to  2 cm around the entire tumor surface.  I did excise an ellipse of skin with  the breast biopsy and did go down to the chest wall.  Sent this off.  I did  mark each margin with a little metal clip. I  marked the medial margin  medially, superior margin as cranial and the lateral margin lateral.   Dr. Almyra Free did a Touch Prep of the breast tissue and these were negative.  I  then went back to the axilla and did a fully axillary node dissection.  I  dissected up cephalad toward the axillary vein and anterior to the  pectoralis major muscle and posterior to the latissimus dorsi muscle.  Identified both the long thoracic nerve of Bell and thoracodorsal nerves  during the dissection and spared these nerves during dissection.  I then  irrigated out the wound with saline.  I placed a 10 mm  Jackson-Pratt drain  and brought it out through a stab wound in the left lower axilla and sewed  that in place with a 3-0 nylon suture.  I irrigated the axilla out, closed  the axilla with a subcutaneous 3-0 Vicryl suture and the skin with skin  staples.  I then cleaned up and marked the right partial mastectomy biopsy  site with tiny clips, closed the subcutaneous tissue with 3-0 Vicryl sutures  and the skin with a 5-0 Monocryl suture, painted with tincture of Benzoin  and Steri-Strips.   The wound was then sterilely dressed.  The patient tolerated the procedure  well, was transported to the recovery room in good condition.  Final  pathology is pending at the time of this dictation.                                               Sandria Bales. Ezzard Standing, M.D.    DHN/MEDQ  D:  07/02/2002  T:  07/02/2002  Job:  [id number]   cc:   [facility]   Cheral Marker, M.D.

## 2010-11-12 NOTE — Op Note (Signed)
NAME:  Rebecca Carrillo, Rebecca Carrillo                        ACCOUNT NO.:  [id number]   MEDICAL RECORD NO.:  [id number]                   PATIENT TYPE:  AMB   LOCATION:  DSC                                  FACILITY:  [facility]   PHYSICIAN:  Sandria Bales. Ezzard Standing, M.D.               DATE OF BIRTH:  [birth year]   DATE OF PROCEDURE:  07/30/2002  DATE OF DISCHARGE:                                 OPERATIVE REPORT   CCS#:  [id number]   PREOPERATIVE DIAGNOSES:  Carcinoma of the right breast which was 1.5 cm in  diameter with 1/21 lymph nodes involved, anticipated chemotherapy.   POSTOPERATIVE DIAGNOSES:  C1N1 carcinoma of the right breast, anticipate  chemotherapy by Dr. Riley Churches.   PROCEDURE:  Left subclavian Port-A-Cath placement.   SURGEON:  Sandria Bales. Ezzard Standing, M.D.   ANESTHESIA:  15 cc of Xylocaine with MAC anesthesia.   INDICATIONS FOR PROCEDURE:  [name] is a pleasant 76 year old black  female who has T1N1 carcinoma of the right breast and she has had  chemotherapy by Dr. Riley Churches and she now comes for Port-A-Cath  placement.   DESCRIPTION OF PROCEDURE:  The patient placed in the supine position with  her arms tucked to her side, a roll under her back. She was given 1 gm of  Ancef at the initiation of the procedure. Her upper chest was prepped with  Betadine solution  and sterilely draped. I used the new Port-A-Cath kit at  [facility], accessed the left subclavian vein with an 18 gauge needle,  threaded a guidewire through the needle, checked the tip of the guidewire  with fluoroscopy.   I then developed a reservoir in the upper aspect of the left breast for the  Port-A-Cath reservoir. The Silastic tubing from the reservoir site to the  subclavian site and placed this in the subclavian vein using the 8 French  introducer and position tip at the junction of the superior vena cava left  the right atrium. This flushed and aspirated easily. I then attached the  Silastic to the reservoir  using attachment device and sewed the reservoir in  place with a 2-0 Vicryl suture. The entire unit had been flushed with dilute  heparin initially and then I used 5 cc of concentrated heparin, 100 units/cc  of heparin. I checked the position of both the tip tubing and port with  fluoroscopy. I then closed the subcutaneous tissues with 3-0 Vicryl suture ,  skin sutured with a 5-0 Vicryl, painted with tinctured Benzoin and Steri-  Strips.   She tolerated her procedure well. Chest x-ray was pending at the time of  this dictation. She will start her chemotherapy next Monday which would be  the 9th of February 2004.  Sandria Bales. Ezzard Standing, M.D.    DHN/MEDQ  D:  07/30/2002  T:  07/30/2002  Job:  [id number]   cc:   Genene Churn. Cyndie Chime, M.D.  [address] [facility]  [place]  [place] [address]  Fax: [phone]   Elizabeth Palau, [facility]

## 2010-11-15 ENCOUNTER — Other Ambulatory Visit: Payer: Self-pay | Admitting: Oncology

## 2010-11-15 ENCOUNTER — Encounter (HOSPITAL_BASED_OUTPATIENT_CLINIC_OR_DEPARTMENT_OTHER): Payer: Medicare Other | Admitting: Oncology

## 2010-11-15 DIAGNOSIS — Z853 Personal history of malignant neoplasm of breast: Secondary | ICD-10-CM

## 2010-11-15 DIAGNOSIS — Z1231 Encounter for screening mammogram for malignant neoplasm of breast: Secondary | ICD-10-CM

## 2010-11-15 DIAGNOSIS — C50919 Malignant neoplasm of unspecified site of unspecified female breast: Secondary | ICD-10-CM

## 2010-11-15 LAB — CBC WITH DIFFERENTIAL/PLATELET
Basophils Absolute: 0 10*3/uL (ref 0.0–0.1)
EOS%: 1.7 % (ref 0.0–7.0)
Eosinophils Absolute: 0.1 10*3/uL (ref 0.0–0.5)
HCT: 37.5 % (ref 34.8–46.6)
HGB: 12.4 g/dL (ref 11.6–15.9)
MCHC: 33.1 g/dL (ref 31.5–36.0)
MONO%: 6.1 % (ref 0.0–14.0)
NEUT#: 3.7 10*3/uL (ref 1.5–6.5)
NEUT%: 65.5 % (ref 38.4–76.8)
Platelets: 178 10*3/uL (ref 145–400)
RBC: 4.37 10*6/uL (ref 3.70–5.45)
WBC: 5.6 10*3/uL (ref 3.9–10.3)

## 2010-11-15 LAB — LACTATE DEHYDROGENASE: LDH: 162 U/L (ref 94–250)

## 2010-11-15 LAB — COMPREHENSIVE METABOLIC PANEL
ALT: 16 U/L (ref 0–35)
AST: 19 U/L (ref 0–37)
Albumin: 4 g/dL (ref 3.5–5.2)
CO2: 29 mEq/L (ref 19–32)
Calcium: 9.3 mg/dL (ref 8.4–10.5)
Chloride: 104 mEq/L (ref 96–112)
Creatinine, Ser: 0.8 mg/dL (ref 0.40–1.20)
Potassium: 3.7 mEq/L (ref 3.5–5.3)
Total Protein: 6.1 g/dL (ref 6.0–8.3)

## 2010-12-27 ENCOUNTER — Encounter (INDEPENDENT_AMBULATORY_CARE_PROVIDER_SITE_OTHER): Payer: Self-pay | Admitting: General Surgery

## 2010-12-27 ENCOUNTER — Ambulatory Visit (INDEPENDENT_AMBULATORY_CARE_PROVIDER_SITE_OTHER): Payer: Medicare Other | Admitting: General Surgery

## 2010-12-27 VITALS — BP 142/92 | HR 68 | Temp 96.7°F | Ht 61.0 in | Wt 159.0 lb

## 2010-12-27 DIAGNOSIS — Z803 Family history of malignant neoplasm of breast: Secondary | ICD-10-CM | POA: Insufficient documentation

## 2010-12-27 NOTE — Progress Notes (Signed)
Subjective:     Patient ID: Rebecca Carrillo, female   DOB: 1934/06/29, 76 y.o.   MRN: [id number]    BP 142/92  Pulse 68  Temp(Src) 96.7 F (35.9 C) (Temporal)  Ht 5\' 1"  (1.549 m)  Wt 159 lb (72.122 kg)  BMI 30.04 kg/m2    HPI This is a 76 year old female who has a history of a right breast cancer treated with lumpectomy and axillary node dissection 2004. She underwent surgery, chemotherapy, radiation therapy. 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 She had a seroma that was aspirated a number of times by Dr. Ovidio Kin. The last was in February. She was due to see Dr. Ezzard Standing again in August. Over the past several months she has developed some fluid underneath her flaps. This is not really bothering her at all and has not really changed over that time period she describes that she does hear some sloshing at times.  Review of Systems     Objective:   Physical Exam well healed right mastectomy incision, she has a minimal amount of fluid on her upper flap otherwise I cannot really identify much to aspirate today, no other masses noted     Assessment:     Right breast cancer s/p simple mastectomy for recurrence, stage 0 Right chest wall seroma     Plan:     I don't think there is anything really to aspirate right now.  I told her if this worsens to come back to see Dr. Ezzard Standing.  Otherwise to just keep her regular appt with Dr. Ezzard Standing in the next 2 months.

## 2011-02-16 ENCOUNTER — Encounter (INDEPENDENT_AMBULATORY_CARE_PROVIDER_SITE_OTHER): Payer: Self-pay | Admitting: General Surgery

## 2011-03-17 ENCOUNTER — Ambulatory Visit: Payer: Medicare Other

## 2011-03-22 ENCOUNTER — Inpatient Hospital Stay: Admission: RE | Admit: 2011-03-22 | Payer: Medicare Other | Source: Ambulatory Visit

## 2011-04-01 ENCOUNTER — Ambulatory Visit (INDEPENDENT_AMBULATORY_CARE_PROVIDER_SITE_OTHER): Payer: Medicare Other | Admitting: Surgery

## 2011-04-01 VITALS — BP 134/86 | HR 68 | Temp 97.1°F | Resp 16 | Ht 61.0 in | Wt 155.0 lb

## 2011-04-01 DIAGNOSIS — C50919 Malignant neoplasm of unspecified site of unspecified female breast: Secondary | ICD-10-CM

## 2011-04-01 DIAGNOSIS — C50911 Malignant neoplasm of unspecified site of right female breast: Secondary | ICD-10-CM

## 2011-04-01 NOTE — Progress Notes (Signed)
ASSESSMENT AND PLAN: 1.  Stage 0 right breast cancer - DCIS  04/08/2010 had a right mastectomy.  Disease free.  Mammograms last week in [place], results okay.  To see me back in 6 months.  2.  T1c, N1 right breast cancer  1.5 cm cancer, 1/21 nodes positive - treated with lumpectomy - 07/02/2002  2.  Right chest wall seroma - resolved.  HISTORY OF PRESENT ILLNESS: Chief Complaint  Patient presents with  . Other    Eval breast for drawing off fluid - oncology on Monday    Rebecca Carrillo is a 76 y.o. (DOB: 12-02-1934)  AA female who is a patient of BURNETT,BRENT A, MD and comes to me today for right breast cancer.  Dr. [name] is her treating oncologist.  She had an invasive ductal carcinoma I took care of in Jan 2004.  Then she had a recurrence of DCIS in the right breast in October 2011, which required a mastectomy.  She had a seroma early, but this is resolved, though she still worries about it coming back.  The patient is to do well. She had a mammogram in [place]. The mammogram was okay, but she had already left the office by the time we got the FAX results. She's noticed no mass, lump, or area of concern on the right chest wall or the left breast.  PHYSICAL EXAM: BP 134/86  Pulse 68  Temp(Src) 97.1 F (36.2 C) (Temporal)  Resp 16  Ht 5\' 1"  (1.549 m)  Wt 155 lb (70.308 kg)  BMI 29.29 kg/m2  HEENT:  Pupils equal.  Dentition good.  No injury. NECK:  Supple.  No thyroid mass. LYMPH NODES:  No cervical, supraclavicular, or axillary adenopathy. BREASTS -  RIGHT:  Absent.  Some pigmentation from rad tx.  No mass or area of concern.   LEFT:  No palpable mass or nodule.  No nipple discharge. UPPER EXTREMITIES:  No evidence of lymphedema.  [facility]

## 2011-04-05 ENCOUNTER — Encounter (HOSPITAL_BASED_OUTPATIENT_CLINIC_OR_DEPARTMENT_OTHER): Payer: Medicare Other | Admitting: Oncology

## 2011-04-05 DIAGNOSIS — C50919 Malignant neoplasm of unspecified site of unspecified female breast: Secondary | ICD-10-CM

## 2011-04-22 ENCOUNTER — Encounter (INDEPENDENT_AMBULATORY_CARE_PROVIDER_SITE_OTHER): Payer: Medicare Other | Admitting: Surgery

## 2011-06-07 ENCOUNTER — Encounter (INDEPENDENT_AMBULATORY_CARE_PROVIDER_SITE_OTHER): Payer: Self-pay

## 2011-08-29 ENCOUNTER — Telehealth: Payer: Self-pay | Admitting: Oncology

## 2011-08-29 NOTE — Telephone Encounter (Signed)
S/w pt re appt for 4/1. 

## 2011-09-22 ENCOUNTER — Telehealth: Payer: Self-pay | Admitting: Oncology

## 2011-09-22 NOTE — Telephone Encounter (Signed)
Pt dtr called today to r/s 4/1 appt. dtr given next available appt for 6/10 @ 10:30 am.

## 2011-09-26 ENCOUNTER — Ambulatory Visit: Payer: Medicare Other | Admitting: Oncology

## 2011-09-26 ENCOUNTER — Other Ambulatory Visit: Payer: Medicare Other | Admitting: Lab

## 2011-10-05 ENCOUNTER — Ambulatory Visit (INDEPENDENT_AMBULATORY_CARE_PROVIDER_SITE_OTHER): Payer: Medicare Other | Admitting: Surgery

## 2011-10-06 ENCOUNTER — Encounter (INDEPENDENT_AMBULATORY_CARE_PROVIDER_SITE_OTHER): Payer: Self-pay | Admitting: Surgery

## 2011-11-29 ENCOUNTER — Telehealth: Payer: Self-pay | Admitting: Oncology

## 2011-11-29 NOTE — Telephone Encounter (Signed)
Talked to pt , gave her appt date for appt with MD on 12/05/11

## 2011-12-02 ENCOUNTER — Other Ambulatory Visit: Payer: Medicare Other | Admitting: Lab

## 2011-12-02 ENCOUNTER — Ambulatory Visit (HOSPITAL_BASED_OUTPATIENT_CLINIC_OR_DEPARTMENT_OTHER): Payer: Medicare Other

## 2011-12-02 ENCOUNTER — Ambulatory Visit (HOSPITAL_BASED_OUTPATIENT_CLINIC_OR_DEPARTMENT_OTHER): Payer: Medicare Other | Admitting: Oncology

## 2011-12-02 ENCOUNTER — Encounter: Payer: Self-pay | Admitting: Oncology

## 2011-12-02 VITALS — BP 117/78 | HR 90 | Temp 98.7°F | Ht 61.0 in | Wt 154.3 lb

## 2011-12-02 DIAGNOSIS — M899 Disorder of bone, unspecified: Secondary | ICD-10-CM

## 2011-12-02 DIAGNOSIS — E039 Hypothyroidism, unspecified: Secondary | ICD-10-CM | POA: Insufficient documentation

## 2011-12-02 DIAGNOSIS — C50919 Malignant neoplasm of unspecified site of unspecified female breast: Secondary | ICD-10-CM

## 2011-12-02 DIAGNOSIS — Z9010 Acquired absence of unspecified breast and nipple: Secondary | ICD-10-CM

## 2011-12-02 DIAGNOSIS — M8580 Other specified disorders of bone density and structure, unspecified site: Secondary | ICD-10-CM

## 2011-12-02 HISTORY — DX: Hypothyroidism, unspecified: E03.9

## 2011-12-02 HISTORY — DX: Other specified disorders of bone density and structure, unspecified site: M85.80

## 2011-12-02 LAB — COMPREHENSIVE METABOLIC PANEL
AST: 18 U/L (ref 0–37)
Albumin: 4.1 g/dL (ref 3.5–5.2)
Alkaline Phosphatase: 67 U/L (ref 39–117)
BUN: 9 mg/dL (ref 6–23)
Creatinine, Ser: 0.92 mg/dL (ref 0.50–1.10)
Potassium: 3.7 mEq/L (ref 3.5–5.3)
Total Bilirubin: 0.5 mg/dL (ref 0.3–1.2)

## 2011-12-02 LAB — CBC WITH DIFFERENTIAL/PLATELET
Basophils Absolute: 0 10*3/uL (ref 0.0–0.1)
HCT: 39.9 % (ref 34.8–46.6)
HGB: 13 g/dL (ref 11.6–15.9)
MONO#: 0.3 10*3/uL (ref 0.1–0.9)
NEUT#: 2.4 10*3/uL (ref 1.5–6.5)
NEUT%: 51.9 % (ref 38.4–76.8)
WBC: 4.6 10*3/uL (ref 3.9–10.3)
lymph#: 1.8 10*3/uL (ref 0.9–3.3)

## 2011-12-02 NOTE — Progress Notes (Signed)
Hematology and Oncology Follow Up Visit  Rebecca Carrillo [id number] 07/25/1934 77 y.o. 12/02/2011 5:49 PM   Principle Diagnosis: Encounter Diagnosis  Name Primary?  . Breast CA Yes     Interim History:    Followup visit for this pleasant 77 year old woman initially diagnosed with stage II, 1 node positive, triple-negative cancer of the right breast in November 2003 treated with lumpectomy, radiation and 6 cycles of FEC chemotherapy.  She then developed a second primary tumor in the same breast.  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  ER-, PR-negative.  After surgical consultation, we elected to have her undergo a simple mastectomy done 04/08/2010.  Pathology showed a 2.4-cm area of DCIS with apocrine features, ER-, PR-negative.  No invasive carcinoma identified and no lesions in the left breast on MRI.    She continues to do well. There's been no evidence for any new disease as of most recent mammogram done on 09/12/2011 which shows breast tissue almost entirely consisting of fat.  She's had no interim medical problems. She recently had an eye exam and was told she has glaucoma. She denies any chest pain, dyspnea, bone pain, change in bowel habits, vaginal bleeding, headache.  Medications: reviewed  Allergies:  Allergies  Allergen Reactions  . Shellfish Allergy Anaphylaxis    Review of Systems: Constitutional:  No constitutional symptoms  Respiratory: No cough or dyspnea Cardiovascular:  No chest pain or palpitations Gastrointestinal: See above Genito-Urinary: See above Musculoskeletal: See above Neurologic: See above Skin: No rash Remaining ROS negative.  Physical Exam: Blood pressure 117/78, pulse 90, temperature 98.7 F (37.1 C), temperature source Oral, height 5\' 1"  (1.549 m), weight 154 lb 4.8 oz (69.99 kg). Wt Readings from Last 3 Encounters:  12/02/11 154 lb 4.8 oz (69.99 kg)  04/01/11 155 lb (70.308 kg)  12/27/10  159 lb (72.122 kg)     General appearance: Well-nourished African American woman HENNT: Pharynx no erythema or exudate Lymph nodes: No cervical supraclavicular or axillary adenopathy Breasts: Right mastectomy. Radiation changes on the chest wall. No cutaneous lesions. No left breast masses. Lungs: Clear to auscultation resonant to percussion Heart: Regular rhythm, 2/6 systolic murmur left sternal border Abdomen: Soft nontender no mass no organomegaly Extremities: No edema no calf tenderness Vascular: No cyanosis Neurologic: Motor strength 5 over 5 reflexes 1+ symmetric Skin: No rash or ecchymosis  Lab Results: Lab Results  Component Value Date   WBC 4.6 12/02/2011   HGB 13.0 12/02/2011   HCT 39.9 12/02/2011   MCV 85.8 12/02/2011   PLT 185 12/02/2011     Chemistry      Component Value Date/Time   NA 141 12/02/2011 1517   K 3.7 12/02/2011 1517   CL 102 12/02/2011 1517   CO2 30 12/02/2011 1517   BUN 9 12/02/2011 1517   CREATININE 0.92 12/02/2011 1517      Component Value Date/Time   CALCIUM 9.9 12/02/2011 1517   ALKPHOS 67 12/02/2011 1517   AST 18 12/02/2011 1517   ALT 12 12/02/2011 1517   BILITOT 0.5 12/02/2011 1517       Radiological Studies: See discussion above  Impression and Plan: 1. Metachronous primary cancers of the right breast treated as outlined above.  She remains free of any obvious new disease at this time. We'll see her again in 6 months. She would like to get her mammogram closer to home in [place] I gave her a prescription for the study which is due in  October. 2. Hypothyroid, on replacement. 3. Osteopenia, now on Fosamax in addition to her calcium and vitamin D supplements.   CC:.    Levert Feinstein, MD 6/7/20135:49 PM

## 2011-12-05 ENCOUNTER — Ambulatory Visit: Payer: Medicare Other | Admitting: Oncology

## 2011-12-05 ENCOUNTER — Other Ambulatory Visit: Payer: Medicare Other

## 2011-12-06 ENCOUNTER — Telehealth: Payer: Self-pay | Admitting: Oncology

## 2011-12-06 NOTE — Telephone Encounter (Signed)
Called pt and left message regarding appt in December 2013  with ML

## 2011-12-16 ENCOUNTER — Ambulatory Visit (INDEPENDENT_AMBULATORY_CARE_PROVIDER_SITE_OTHER): Payer: Medicare Other | Admitting: Surgery

## 2011-12-16 VITALS — BP 110/70 | HR 78 | Temp 96.6°F | Resp 18 | Ht 61.0 in | Wt 154.0 lb

## 2011-12-16 DIAGNOSIS — C50911 Malignant neoplasm of unspecified site of right female breast: Secondary | ICD-10-CM

## 2011-12-16 DIAGNOSIS — C50919 Malignant neoplasm of unspecified site of unspecified female breast: Secondary | ICD-10-CM

## 2011-12-16 NOTE — Progress Notes (Signed)
ASSESSMENT AND PLAN: 1.  Stage 0 right breast cancer - DCIS (2.4 cm area)  04/08/2010 had a right mastectomy.  Disease free.  Mammograms in [place] this fall.  To see me back in 6 months.  2.  T1c, N1 right breast cancer  1.5 cm cancer, 1/21 nodes positive - treated with lumpectomy - 07/02/2002 3.  Osteopenia.  HISTORY OF PRESENT ILLNESS: Chief Complaint  Patient presents with  . Breast Cancer Long Term Follow Up    Rebecca Carrillo is a 77 y.o. (DOB: [birth year])  AA female who is a patient of HILL,STACY, NP and comes to me today for follow up of right breast cancer.     Dr. Cyndie Chime is her treating oncologist.  She had an invasive ductal carcinoma I took care of in Jan 2004. Then she had a recurrence of DCIS in the right breast in October 2011, which required a mastectomy.   The patient is doing well. She had a mammogram in [place]. The mammogram was okay, but she had already left the office by the time we got the FAX results.   She's noticed no mass, lump, or area of concern on the right chest wall or the left breast.  PHYSICAL EXAM: BP 110/70  Pulse 78  Temp 96.6 F (35.9 C) (Temporal)  Resp 18  Ht 5\' 1"  (1.549 m)  Wt 154 lb (69.854 kg)  BMI 29.10 kg/m2  HEENT:  Pupils equal.  NECK:  Supple.  No thyroid mass. LYMPH NODES:  No cervical, supraclavicular, or axillary adenopathy. BREASTS -  RIGHT:  Absent.  Some pigmentation from rad tx.  No mass or area of concern.   LEFT:  No palpable mass or nodule.  No nipple discharge. UPPER EXTREMITIES:  No evidence of lymphedema.  [facility] neg[facility]

## 2012-02-09 ENCOUNTER — Telehealth: Payer: Self-pay | Admitting: *Deleted

## 2012-02-09 NOTE — Telephone Encounter (Signed)
Pt given script for left mammogram screening for October 2013, Hx metachornous breast cancers sp right mastectomy per Dr. Cyndie Chime on 12/02/11 & to please mail report to him.

## 2012-03-28 ENCOUNTER — Telehealth: Payer: Self-pay | Admitting: *Deleted

## 2012-03-28 NOTE — Telephone Encounter (Signed)
Patient called re: mammogram.  She lost the script Dr. Cyndie Chime gave her.  Her last one was 03/22/11 at [facility] the [facility].  She will get her next one at [facility], but she will call [facility] and have them send the 03/22/11 one for comparison.

## 2012-06-01 IMAGING — CR DG CHEST 2V
2 series · 2 of 2 positions shown · non-contrast
Comparison: None.

CLINICAL DATA: Right breast carcinoma. Pre-op respiratory exam.

CHEST - 2 VIEW

[w chest pa]
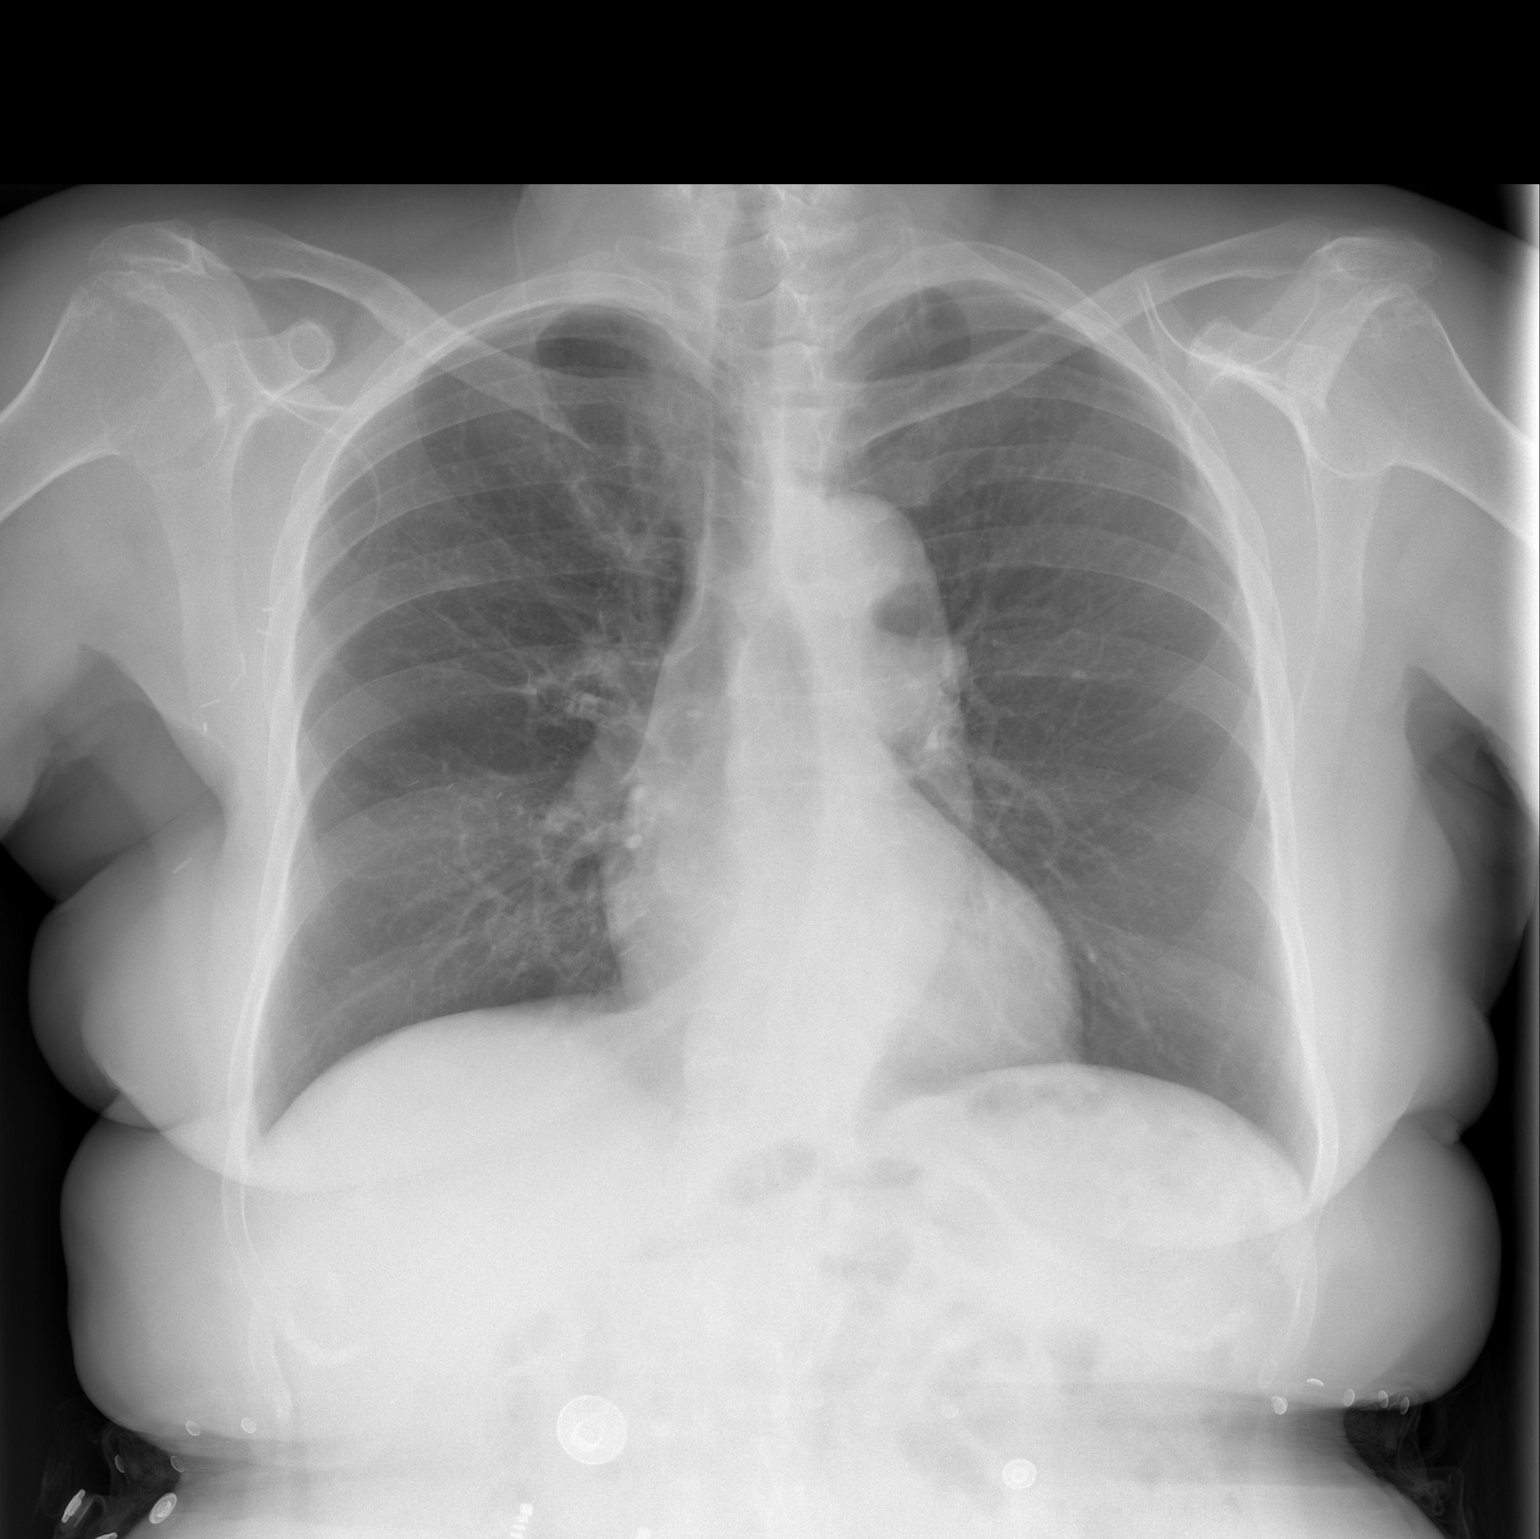

[w chest lat]
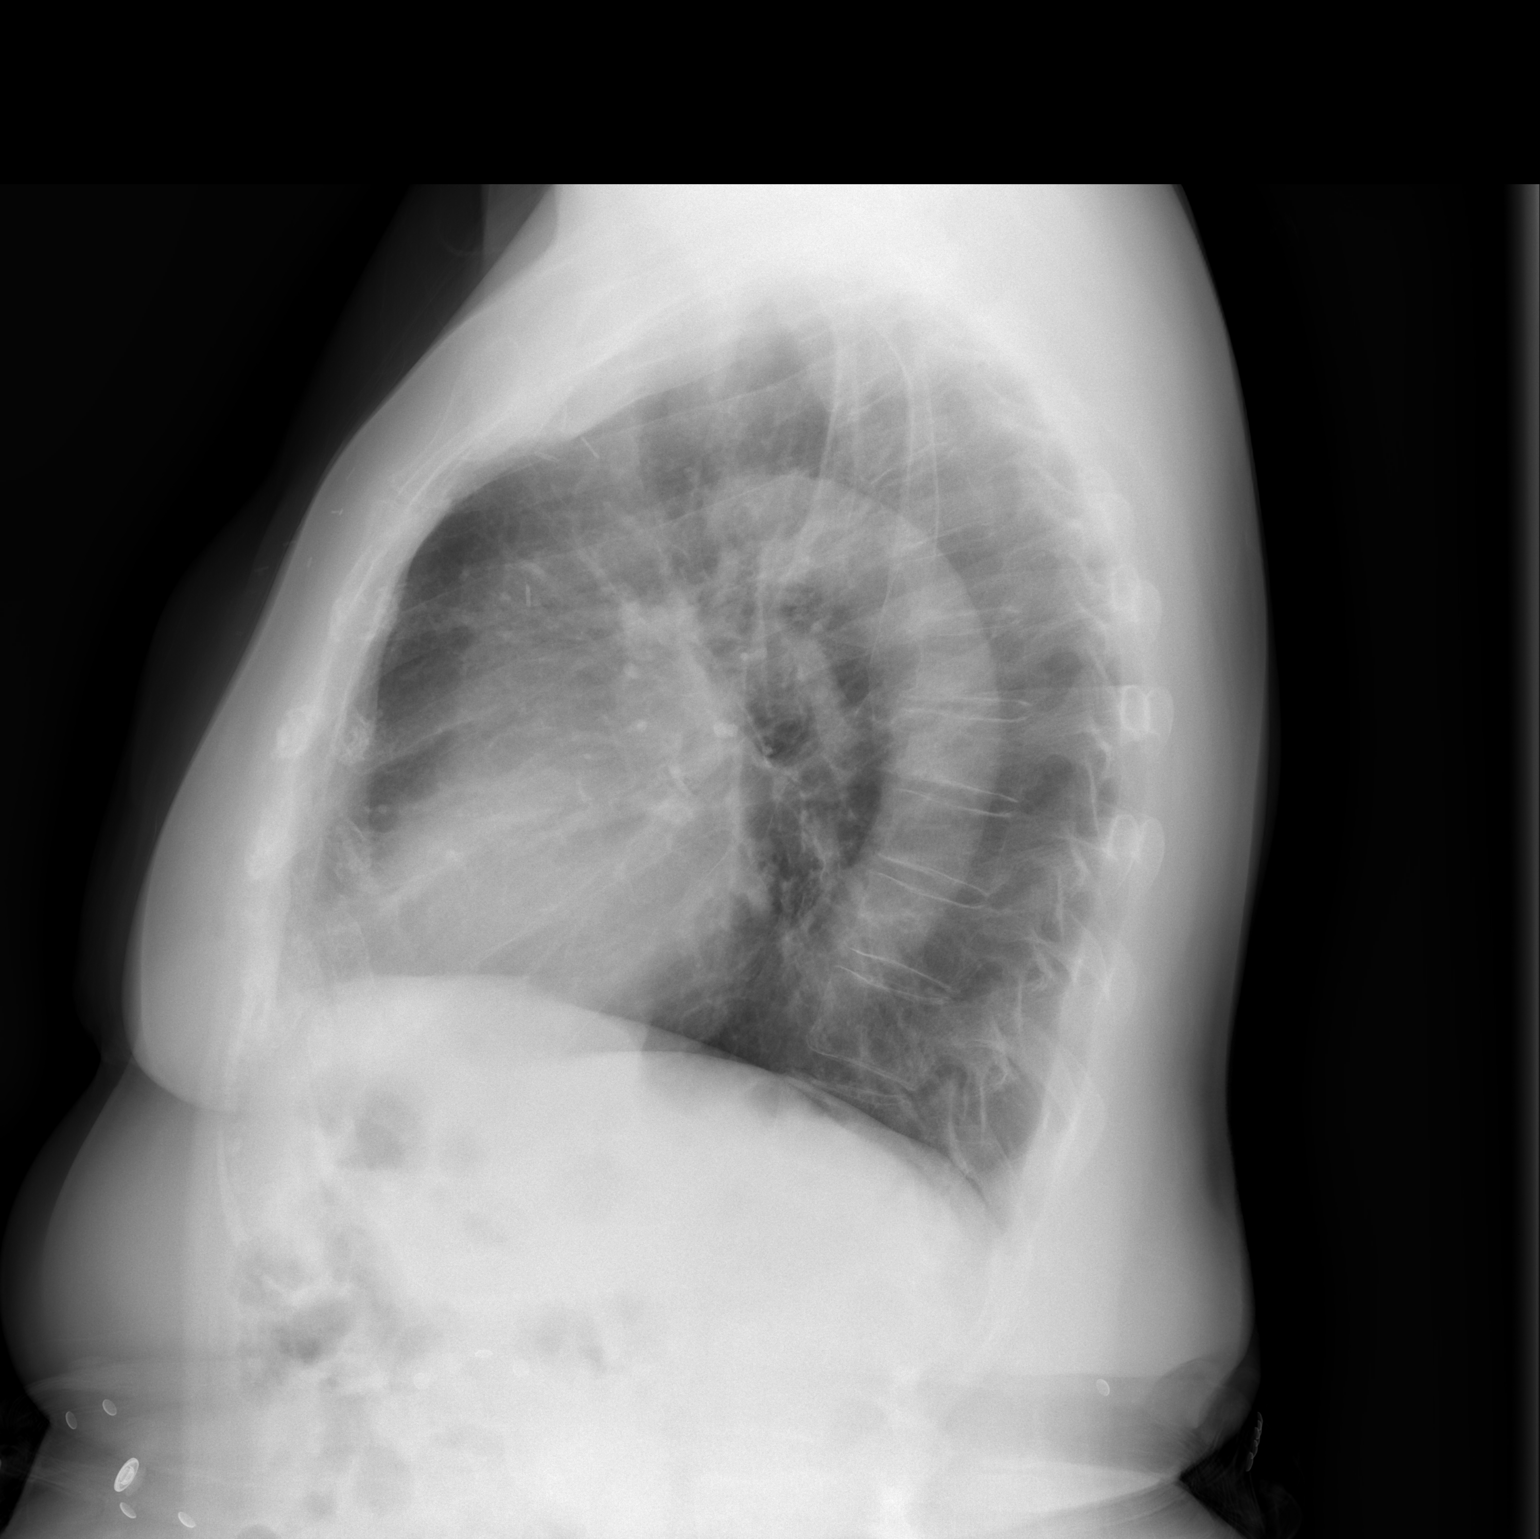

[2 of 2 positions shown; findings below may reference images not displayed]

FINDINGS: The heart size and mediastinal contours are within
normal limits.  Both lungs are clear.  The visualized skeletal
structures are unremarkable. Surgical clips noted in the right
axilla.
IMPRESSION: No active cardiopulmonary disease.

## 2012-06-08 ENCOUNTER — Telehealth: Payer: Self-pay | Admitting: Oncology

## 2012-06-08 ENCOUNTER — Ambulatory Visit (HOSPITAL_BASED_OUTPATIENT_CLINIC_OR_DEPARTMENT_OTHER): Payer: Medicare Other | Admitting: Nurse Practitioner

## 2012-06-08 VITALS — BP 135/79 | HR 91 | Temp 97.6°F | Resp 20 | Ht 61.0 in | Wt 158.5 lb

## 2012-06-08 DIAGNOSIS — M899 Disorder of bone, unspecified: Secondary | ICD-10-CM

## 2012-06-08 DIAGNOSIS — C50919 Malignant neoplasm of unspecified site of unspecified female breast: Secondary | ICD-10-CM

## 2012-06-08 DIAGNOSIS — C50911 Malignant neoplasm of unspecified site of right female breast: Secondary | ICD-10-CM

## 2012-06-08 NOTE — Progress Notes (Signed)
OFFICE PROGRESS NOTE  Interval history:  Rebecca Carrillo is a 77 year old woman initially diagnosed with a stage II, 1 node positive, triple negative cancer of the right breast in November 2003 treated with lumpectomy, radiation and 6 cycles of FEC chemotherapy. She developed a second primary tumor in the same breast with biopsy on 03/15/2010 showing DCIS with apocrine features focally involving a fibroadenoma; ER and PR negative. She underwent a simple mastectomy on 04/08/2010 with pathology showing a 2.4 cm area of DCIS with apocrine features, ER/PR negative. No invasive carcinoma was identified and there were no lesions in the left breast on MRI.  She is seen today for scheduled followup. She feels well. She has a good appetite. She denies pain. No shortness of breath or cough. No fevers or sweats. She denies nausea/vomiting. No constipation or diarrhea. She reports having a mammogram in [place] in November of this year reported to her as negative. No changes over the chest wall.   Objective: Blood pressure 135/79, pulse 91, temperature 97.6 F (36.4 C), temperature source Oral, resp. rate 20, height 5\' 1"  (1.549 m), weight 158 lb 8 oz (71.895 kg).  Oropharynx is without thrush or ulceration. No palpable cervical, supraclavicular or axillary lymph nodes. Status post right mastectomy. No evidence of chest wall recurrence. No masses palpated in the left breast. Lungs clear. Regular cardiac rhythm. Abdomen is soft and nontender. No hepatomegaly. Extremities without edema. Calves are soft and nontender.  Lab Results: Lab Results  Component Value Date   WBC 4.6 12/02/2011   HGB 13.0 12/02/2011   HCT 39.9 12/02/2011   MCV 85.8 12/02/2011   PLT 185 12/02/2011    Chemistry:    Chemistry      Component Value Date/Time   NA 141 12/02/2011 1517   K 3.7 12/02/2011 1517   CL 102 12/02/2011 1517   CO2 30 12/02/2011 1517   BUN 9 12/02/2011 1517   CREATININE 0.92 12/02/2011 1517      Component Value Date/Time   CALCIUM 9.9 12/02/2011 1517   ALKPHOS 67 12/02/2011 1517   AST 18 12/02/2011 1517   ALT 12 12/02/2011 1517   BILITOT 0.5 12/02/2011 1517       Studies/Results: No results found.  Medications: I have reviewed the patient's current medications.  Assessment/Plan:  1. Stage II, 1 node positive, triple negative cancer of the right breast in November 2003 treated with lumpectomy, radiation and 6 cycles of FEC chemotherapy. She developed a second primary tumor in the same breast with biopsy on 03/15/2010 showing DCIS with apocrine features focally involving a fibroadenoma; ER and PR negative. She underwent a simple mastectomy on 04/08/2010 with pathology showing a 2.4 cm area of DCIS with apocrine features, ER/PR negative. No invasive carcinoma was identified and there were no lesions in the left breast on MRI. 2. Hypothyroid. On replacement. 3. Osteopenia.  Disposition-Ms. Benak appears stable. She will return for a followup visit in 6 months. She will contact the office in the interim with any problems.   Lonna Cobb ANP/GNP-BC

## 2012-06-08 NOTE — Telephone Encounter (Signed)
gv and printed appt schedule for pt for June 2014 ° °

## 2012-07-25 ENCOUNTER — Ambulatory Visit (INDEPENDENT_AMBULATORY_CARE_PROVIDER_SITE_OTHER): Payer: Medicare Other | Admitting: Surgery

## 2012-08-22 ENCOUNTER — Encounter (INDEPENDENT_AMBULATORY_CARE_PROVIDER_SITE_OTHER): Payer: Self-pay | Admitting: Surgery

## 2012-08-22 ENCOUNTER — Ambulatory Visit (INDEPENDENT_AMBULATORY_CARE_PROVIDER_SITE_OTHER): Payer: Medicare Other | Admitting: Surgery

## 2012-08-22 VITALS — BP 120/72 | HR 75 | Temp 95.7°F | Resp 18 | Ht 61.0 in | Wt 158.2 lb

## 2012-08-22 DIAGNOSIS — C50919 Malignant neoplasm of unspecified site of unspecified female breast: Secondary | ICD-10-CM

## 2012-08-22 DIAGNOSIS — C50911 Malignant neoplasm of unspecified site of right female breast: Secondary | ICD-10-CM

## 2012-08-22 NOTE — Progress Notes (Signed)
Name:  Rebecca Carrillo MRN:  [id number] DOB:  1935/02/07  ASSESSMENT AND PLAN: 1.  Stage 0 right breast cancer - DCIS (2.4 cm area)  04/08/2010 had a right mastectomy.  Disease free.  Mammograms in [place] this fall.  To see me back in 6 months.  2.  T1c, N1 right breast cancer  1.5 cm cancer, 1/21 nodes positive - treated with lumpectomy - 07/02/2002 3.  Osteopenia.  HISTORY OF PRESENT ILLNESS: Chief Complaint  Patient presents with  . Breast Cancer Long Term Follow Up    LTF   Rebecca Carrillo is a 78 y.o. (DOB: 24-Jan-1935)  AA female who is a patient of HILL,STACY, NP and comes to me today for follow up of right breast cancer.     Dr. Cyndie Chime is her treating oncologist.  She had an invasive ductal carcinoma I took care of in Jan 2004. Then she had a recurrence with DCIS in the right breast in October 2011, which required a mastectomy.  She had a mammogram in [place]. The mammogram was okay.   The patient is doing well.    She's noticed no mass, lump, or area of concern on the right chest wall or the left breast.  PHYSICAL EXAM: BP 120/72  Pulse 75  Temp(Src) 95.7 F (35.4 C) (Temporal)  Resp 18  Ht 5\' 1"  (1.549 m)  Wt 158 lb 3.2 oz (71.759 kg)  BMI 29.91 kg/m2  HEENT:  Pupils equal.  NECK:  Supple.  No thyroid mass. LYMPH NODES:  No cervical, supraclavicular, or axillary adenopathy. BREASTS -  RIGHT:  Absent.  Some pigmentation from rad tx.  No mass or area of concern.   LEFT:  No palpable mass or nodule.  No nipple discharge. UPPER EXTREMITIES:  No evidence of lymphedema.  DATA REVIEWED: [facility] mammogram - 05/02/2012 - neg.

## 2012-12-07 ENCOUNTER — Ambulatory Visit (HOSPITAL_BASED_OUTPATIENT_CLINIC_OR_DEPARTMENT_OTHER): Payer: Medicare Other | Admitting: Oncology

## 2012-12-07 ENCOUNTER — Telehealth: Payer: Self-pay | Admitting: Oncology

## 2012-12-07 VITALS — BP 111/75 | HR 66 | Temp 97.4°F | Resp 18 | Ht 61.0 in | Wt 151.6 lb

## 2012-12-07 DIAGNOSIS — M899 Disorder of bone, unspecified: Secondary | ICD-10-CM

## 2012-12-07 DIAGNOSIS — C50911 Malignant neoplasm of unspecified site of right female breast: Secondary | ICD-10-CM

## 2012-12-07 DIAGNOSIS — C50919 Malignant neoplasm of unspecified site of unspecified female breast: Secondary | ICD-10-CM

## 2012-12-07 NOTE — Progress Notes (Signed)
Hematology and Oncology Follow Up Visit  Rebecca Carrillo [id number] 1934/09/28 78 y.o. 12/07/2012 6:29 PM   Principle Diagnosis: Encounter Diagnosis  Name Primary?  . Breast cancer, right breast, 2004 and 2011. Yes     Interim History:   Followup visit for this pleasant 78 year old woman initially diagnosed with stage II, 1 node positive, triple-negative cancer of the right breast in November 2003 treated with lumpectomy, radiation and 6 cycles of FEC chemotherapy. She then developed a second primary tumor in the same breast. 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 ER-, PR-negative. After surgical consultation, we elected to have her undergo a simple mastectomy done 04/08/2010. Pathology showed a 2.4-cm area of DCIS with apocrine features, ER-, PR-negative. No invasive carcinoma identified and no lesions in the left breast on MRI. She has been followed with observation alone.  She is doing well at this time. She has had no interim medical problems. She denies any headache or change in vision. No dyspnea chest pain or palpitations. No change in bowel habit. No vaginal bleeding. No bone pain.   Medications: reviewed  Allergies:  Allergies  Allergen Reactions  . Shellfish Allergy Anaphylaxis    Review of Systems: See history of present illness Remaining ROS negative.  Physical Exam: Blood pressure 111/75, pulse 66, temperature 97.4 F (36.3 C), temperature source Oral, resp. rate 18, height 5\' 1"  (1.549 m), weight 151 lb 9.6 oz (68.765 kg). Wt Readings from Last 3 Encounters:  12/07/12 151 lb 9.6 oz (68.765 kg)  08/22/12 158 lb 3.2 oz (71.759 kg)  06/08/12 158 lb 8 oz (71.895 kg)     General appearance: Well-nourished African American woman HENNT: Pharynx no erythema or exudate Lymph nodes: No cervical supraclavicular or axillary adenopathy Breasts: Right mastectomy. No chest wall lesions. Chronic inversion of the left nipple.  No dominant mass in the left breast. Lungs: Clear to auscultation resonant to percussion Heart: Regular rhythm 2/6 aortic systolic murmur Abdomen: Soft, nontender, no mass no organomegaly Extremities: No edema, no calf tenderness Musculoskeletal: No joint deformities GU: Vascular: No carotid bruits, no cyanosis Neurologic: Mental status intact, motor strength 5 over 5, reflexes 1+ symmetric Skin: No rash or ecchymosis  Lab Results: Lab Results  Component Value Date   WBC 4.6 12/02/2011   HGB 13.0 12/02/2011   HCT 39.9 12/02/2011   MCV 85.8 12/02/2011   PLT 185 12/02/2011     Chemistry      Component Value Date/Time   NA 141 12/02/2011 1517   K 3.7 12/02/2011 1517   CL 102 12/02/2011 1517   CO2 30 12/02/2011 1517   BUN 9 12/02/2011 1517   CREATININE 0.92 12/02/2011 1517      Component Value Date/Time   CALCIUM 9.9 12/02/2011 1517   ALKPHOS 67 12/02/2011 1517   AST 18 12/02/2011 1517   ALT 12 12/02/2011 1517   BILITOT 0.5 12/02/2011 1517       Radiological Studies: Mammogram due this November   Impression: #1. Metachronous primary cancers of the right breast treated as outlined above. She remains free of any obvious new disease at this time now out almost 11 years from her invasive cancer and for years from her intraductal carcinoma.Maryclare Labrador see her again in 6 months.   #2. Hypothyroid, on replacement.   #3. Osteopenia, now on Fosamax in addition to her calcium and vitamin D supplements.   Plan:   CC:Marland Kitchen    Levert Feinstein, MD 6/13/20146:29 PM

## 2013-05-06 ENCOUNTER — Ambulatory Visit: Payer: Medicare Other

## 2013-08-24 ENCOUNTER — Telehealth: Payer: Self-pay | Admitting: *Deleted

## 2013-08-24 ENCOUNTER — Encounter: Payer: Self-pay | Admitting: Oncology

## 2013-08-24 NOTE — Telephone Encounter (Signed)
Mailed provider change letter w/ calendar and cancelled Dr. Synthia Innocent appt.

## 2013-12-05 ENCOUNTER — Telehealth: Payer: Self-pay | Admitting: Internal Medicine

## 2013-12-05 ENCOUNTER — Other Ambulatory Visit (HOSPITAL_BASED_OUTPATIENT_CLINIC_OR_DEPARTMENT_OTHER): Payer: Medicare Other

## 2013-12-05 ENCOUNTER — Ambulatory Visit (HOSPITAL_BASED_OUTPATIENT_CLINIC_OR_DEPARTMENT_OTHER): Payer: Medicare Other | Admitting: Internal Medicine

## 2013-12-05 ENCOUNTER — Encounter: Payer: Self-pay | Admitting: Internal Medicine

## 2013-12-05 VITALS — BP 119/81 | HR 80 | Temp 97.8°F | Resp 18 | Ht 61.0 in | Wt 154.5 lb

## 2013-12-05 DIAGNOSIS — Z853 Personal history of malignant neoplasm of breast: Secondary | ICD-10-CM

## 2013-12-05 DIAGNOSIS — C50919 Malignant neoplasm of unspecified site of unspecified female breast: Secondary | ICD-10-CM

## 2013-12-05 DIAGNOSIS — C50911 Malignant neoplasm of unspecified site of right female breast: Secondary | ICD-10-CM

## 2013-12-05 DIAGNOSIS — D0590 Unspecified type of carcinoma in situ of unspecified breast: Secondary | ICD-10-CM

## 2013-12-05 LAB — CBC WITH DIFFERENTIAL/PLATELET
BASO%: 0.2 % (ref 0.0–2.0)
BASOS ABS: 0 10*3/uL (ref 0.0–0.1)
EOS%: 2.9 % (ref 0.0–7.0)
Eosinophils Absolute: 0.1 10*3/uL (ref 0.0–0.5)
HEMATOCRIT: 38.4 % (ref 34.8–46.6)
HEMOGLOBIN: 12.5 g/dL (ref 11.6–15.9)
LYMPH#: 1.6 10*3/uL (ref 0.9–3.3)
LYMPH%: 32.6 % (ref 14.0–49.7)
MCH: 27.7 pg (ref 25.1–34.0)
MCHC: 32.6 g/dL (ref 31.5–36.0)
MCV: 85.1 fL (ref 79.5–101.0)
MONO#: 0.3 10*3/uL (ref 0.1–0.9)
MONO%: 6.6 % (ref 0.0–14.0)
NEUT%: 57.7 % (ref 38.4–76.8)
NEUTROS ABS: 2.8 10*3/uL (ref 1.5–6.5)
Platelets: 167 10*3/uL (ref 145–400)
RBC: 4.51 10*6/uL (ref 3.70–5.45)
RDW: 14 % (ref 11.2–14.5)
WBC: 4.9 10*3/uL (ref 3.9–10.3)

## 2013-12-05 LAB — COMPREHENSIVE METABOLIC PANEL (CC13)
ALBUMIN: 3.9 g/dL (ref 3.5–5.0)
ALT: 15 U/L (ref 0–55)
ANION GAP: 9 meq/L (ref 3–11)
AST: 21 U/L (ref 5–34)
Alkaline Phosphatase: 52 U/L (ref 40–150)
BUN: 9.6 mg/dL (ref 7.0–26.0)
CALCIUM: 9.9 mg/dL (ref 8.4–10.4)
CHLORIDE: 103 meq/L (ref 98–109)
CO2: 32 meq/L — AB (ref 22–29)
Creatinine: 1 mg/dL (ref 0.6–1.1)
Glucose: 97 mg/dl (ref 70–140)
POTASSIUM: 4.2 meq/L (ref 3.5–5.1)
Sodium: 143 mEq/L (ref 136–145)
Total Bilirubin: 1.09 mg/dL (ref 0.20–1.20)
Total Protein: 6.5 g/dL (ref 6.4–8.3)

## 2013-12-05 NOTE — Progress Notes (Signed)
[facility] [phone]) [phone]   Fax:([phone]  OFFICE PROGRESS NOTE  HILL,STACY, NP [facility]  DIAGNOSIS AND PRIOR THERAPY:  1) Stage II, 1 node positive, triple-negative cancer of the right breast in November 2003 treated with lumpectomy, radiation and 6 cycles of FEC chemotherapy. 2) DCIS with apocrine features focally involved in a fibroadenoma was ER negative, PR negative of the right breast diagnosed on 03/15/2010. Status post right mastectomy on 04/08/2010, Pathology showed a 2.4-cm area of DCIS with apocrine features, ER-, PR-negative. No invasive carcinoma identified and no lesions in the left breast on MRI.  CURRENT THERAPY: Observation.  INTERVAL HISTORY: Rebecca Carrillo 79 y.o. female returns to the clinic today for annual followup visit. She is a former patient of Dr. Beryle Beams who came to the clinic today to establish care with me after he left the practice. The patient is feeling fine today with no specific complaints. She has been observation since October of 2011. She denied having any significant chest pain, shortness breath, cough or hemoptysis. She denied having any weight loss or night sweats. She has no nausea or vomiting. Her last mammogram was performed on 07/03/2012. She has repeat CBC and comprehensive metabolic panel performed earlier today and she is here for evaluation and discussion of her lab results.  MEDICAL HISTORY: Past Medical History  Diagnosis Date  . Asthma   . Breast cancer, stage 0     treated with simple mastectomy  . Breast cancer     tx with chemo/xrt/lumpectomy/alnd  . Hypothyroidism (acquired) 12/02/2011  . Osteopenia of the elderly 12/02/2011    ALLERGIES:  is allergic to shellfish allergy.  MEDICATIONS:  Current Outpatient Prescriptions  Medication Sig Dispense Refill  . albuterol (PROVENTIL HFA;VENTOLIN HFA) 108 (90 BASE) MCG/ACT inhaler Inhale 2 puffs into the lungs every 6 (six)  hours as needed for wheezing.      Marland Kitchen alendronate (FOSAMAX) 70 MG tablet Take 70 mg by mouth every 7 (seven) days. Take with a full glass of water on an empty stomach.      Marland Kitchen aspirin 81 MG tablet Take 81 mg by mouth daily.      . Calcium-Vitamin D-Vitamin K 500-100-40 MG-UNT-MCG CHEW Chew 1 tablet by mouth 2 (two) times daily.      . clonazePAM (KLONOPIN) 0.5 MG tablet Take 0.5 mg by mouth daily.      . hydrochlorothiazide (HYDRODIURIL) 25 MG tablet Take 25 mg by mouth daily.      Marland Kitchen levothyroxine (SYNTHROID, LEVOTHROID) 50 MCG tablet Take 50 mcg by mouth daily before breakfast. Takes 1/2 tab on mondays, 1 tab on other days.      Marland Kitchen lisinopril (PRINIVIL,ZESTRIL) 10 MG tablet Take 10 mg by mouth daily.      . Multiple Vitamin (MULTIVITAMIN) tablet Take 1 tablet by mouth daily.      . NON FORMULARY Take 1 oz by mouth 2 (two) times daily. nopalea      . polyethylene glycol (MIRALAX / GLYCOLAX) packet Take 17 g by mouth as needed.      . Wheat Dextrin (BENEFIBER) POWD Take 1 scoop by mouth as needed.       No current facility-administered medications for this visit.    SURGICAL HISTORY:  Past Surgical History  Procedure Laterality Date  . Breast surgery  2003    lumpectomy, right alnd  . Breast surgery      right simple mastectomy  REVIEW OF SYSTEMS:  Constitutional: negative Eyes: negative Ears, nose, mouth, throat, and face: negative Respiratory: negative Cardiovascular: negative Gastrointestinal: negative Genitourinary:negative Integument/breast: negative Hematologic/lymphatic: negative Musculoskeletal:negative Neurological: negative Behavioral/Psych: negative Endocrine: negative Allergic/Immunologic: negative   PHYSICAL EXAMINATION: General appearance: alert, cooperative and no distress Head: Normocephalic, without obvious abnormality, atraumatic Neck: no adenopathy, no JVD, supple, symmetrical, trachea midline and thyroid not enlarged, symmetric, no  tenderness/mass/nodules Lymph nodes: Cervical, supraclavicular, and axillary nodes normal. Resp: clear to auscultation bilaterally Back: symmetric, no curvature. ROM normal. No CVA tenderness. Cardio: regular rate and rhythm, S1, S2 normal, no murmur, click, rub or gallop GI: soft, non-tender; bowel sounds normal; no masses,  no organomegaly Extremities: extremities normal, atraumatic, no cyanosis or edema Neurologic: Alert and oriented X 3, normal strength and tone. Normal symmetric reflexes. Normal coordination and gait  ECOG PERFORMANCE STATUS: 1 - Symptomatic but completely ambulatory  Blood pressure 119/81, pulse 80, temperature 97.8 F (36.6 C), temperature source Oral, resp. rate 18, height 5\' 1"  (1.549 m), weight 154 lb 8 oz (70.081 kg), SpO2 99.00%.  LABORATORY DATA: Lab Results  Component Value Date   WBC 4.9 12/05/2013   HGB 12.5 12/05/2013   HCT 38.4 12/05/2013   MCV 85.1 12/05/2013   PLT 167 12/05/2013      Chemistry      Component Value Date/Time   NA 141 12/02/2011 1517   K 3.7 12/02/2011 1517   CL 102 12/02/2011 1517   CO2 30 12/02/2011 1517   BUN 9 12/02/2011 1517   CREATININE 0.92 12/02/2011 1517      Component Value Date/Time   CALCIUM 9.9 12/02/2011 1517   ALKPHOS 67 12/02/2011 1517   AST 18 12/02/2011 1517   ALT 12 12/02/2011 1517   BILITOT 0.5 12/02/2011 1517       RADIOGRAPHIC STUDIES: No results found.  ASSESSMENT AND PLAN: This is a very pleasant 79 years old [place] American female with history of right breast carcinoma status post right simple mastectomy and has been observation since October of 2007 was no evidence for disease recurrence. The last mammogram on record was in January 2014 but the patient mentioned that she had a mammogram than a year ago. Her blood work is unremarkable today. I recommended for her to continue on observation with repeat CBC, comprehensive metabolic panel and CA 21.19 in one year. I also recommended for her to contact the breast center  to get her mammogram scheduled. She was advised to call immediately if she has any concerning symptoms in the interval. The patient voices understanding of current disease status and treatment options and is in agreement with the current care plan.  All questions were answered. The patient knows to call the clinic with any problems, questions or concerns. We can certainly see the patient much sooner if necessary.  I spent 15 minutes counseling the patient face to face. The total time spent in the appointment was 25 minutes.  Disclaimer: This note was dictated with voice recognition software. Similar sounding words can inadvertently be transcribed and may not be corrected upon review.

## 2013-12-05 NOTE — Telephone Encounter (Signed)
Gave pt appt for lab and MD for 2016  °

## 2013-12-06 ENCOUNTER — Ambulatory Visit: Payer: Medicare Other | Admitting: Oncology

## 2013-12-06 ENCOUNTER — Other Ambulatory Visit: Payer: Medicare Other

## 2014-12-04 ENCOUNTER — Other Ambulatory Visit: Payer: Medicare Other

## 2014-12-08 ENCOUNTER — Ambulatory Visit: Payer: Medicare Other | Admitting: Internal Medicine

## 2019-10-05 ENCOUNTER — Ambulatory Visit: Payer: Medicare Other | Attending: Internal Medicine

## 2019-10-05 DIAGNOSIS — Z23 Encounter for immunization: Secondary | ICD-10-CM

## 2019-10-05 NOTE — Progress Notes (Signed)
   Covid-19 Vaccination Clinic  Name:  Rebecca Carrillo    MRN: [id number] DOB: 01/04/1935  10/05/2019  [name] was observed post Covid-19 immunization for 15 minutes without incident. She was provided with Vaccine Information Sheet and instruction to access the V-Safe system.   [name] was instructed to call 911 with any severe reactions post vaccine: Marland Kitchen Difficulty breathing  . Swelling of face and throat  . A fast heartbeat  . A bad rash all over body  . Dizziness and weakness   Immunizations Administered    Name Date Dose VIS Date Route   Pfizer COVID-19 Vaccine 10/05/2019  2:49 PM 0.3 mL 06/07/2019 Intramuscular   Manufacturer: [place]   Lot: [id number]   [place]: [id number]

## 2019-10-29 ENCOUNTER — Ambulatory Visit: Payer: Medicare Other | Attending: Internal Medicine

## 2019-10-29 DIAGNOSIS — Z23 Encounter for immunization: Secondary | ICD-10-CM

## 2019-10-29 NOTE — Progress Notes (Signed)
   Covid-19 Vaccination Clinic  Name:  Rebecca Carrillo    MRN: [id number] DOB: 1935-05-31  10/29/2019  [name] was observed post Covid-19 immunization for 15 minutes without incident. She was provided with Vaccine Information Sheet and instruction to access the V-Safe system.   [name] was instructed to call 911 with any severe reactions post vaccine: Marland Kitchen Difficulty breathing  . Swelling of face and throat  . A fast heartbeat  . A bad rash all over body  . Dizziness and weakness   Immunizations Administered    Name Date Dose VIS Date Route   Pfizer COVID-19 Vaccine 10/29/2019  2:51 PM 0.3 mL 08/21/2018 Intramuscular   Manufacturer: [place]   Lot: [id number]   [place]: [id number]

## 2024-02-26 DEATH — deceased
